# Patient Record
Sex: Female | Born: 1960 | Race: White | Hispanic: No | Marital: Married | Smoking: Former smoker
Health system: Southern US, Community
[De-identification: ages and names within clinical notes are randomized; demographics above are authoritative.]

## PROBLEM LIST (undated history)

## (undated) DIAGNOSIS — M459 Ankylosing spondylitis of unspecified sites in spine: Secondary | ICD-10-CM

## (undated) HISTORY — PX: DILATION AND CURETTAGE OF UTERUS: SHX78

## (undated) HISTORY — PX: UTERINE FIBROID SURGERY: SHX826

## (undated) HISTORY — PX: TONSILLECTOMY: SUR1361

---

## 1998-05-19 ENCOUNTER — Encounter: Payer: Self-pay | Admitting: Obstetrics and Gynecology

## 1998-05-19 ENCOUNTER — Ambulatory Visit (HOSPITAL_COMMUNITY): Admission: RE | Admit: 1998-05-19 | Discharge: 1998-05-19 | Payer: Self-pay | Admitting: Obstetrics and Gynecology

## 1998-07-04 ENCOUNTER — Ambulatory Visit (HOSPITAL_COMMUNITY): Admission: RE | Admit: 1998-07-04 | Discharge: 1998-07-04 | Payer: Self-pay | Admitting: Obstetrics and Gynecology

## 1998-08-25 ENCOUNTER — Other Ambulatory Visit: Admission: RE | Admit: 1998-08-25 | Discharge: 1998-08-25 | Payer: Self-pay | Admitting: Obstetrics and Gynecology

## 1999-07-29 ENCOUNTER — Encounter (INDEPENDENT_AMBULATORY_CARE_PROVIDER_SITE_OTHER): Payer: Self-pay | Admitting: Specialist

## 1999-07-29 ENCOUNTER — Inpatient Hospital Stay (HOSPITAL_COMMUNITY): Admission: RE | Admit: 1999-07-29 | Discharge: 1999-07-30 | Payer: Self-pay | Admitting: Obstetrics and Gynecology

## 1999-10-02 ENCOUNTER — Other Ambulatory Visit: Admission: RE | Admit: 1999-10-02 | Discharge: 1999-10-02 | Payer: Self-pay | Admitting: Obstetrics and Gynecology

## 2001-01-23 ENCOUNTER — Other Ambulatory Visit: Admission: RE | Admit: 2001-01-23 | Discharge: 2001-01-23 | Payer: Self-pay | Admitting: Obstetrics and Gynecology

## 2002-11-12 ENCOUNTER — Other Ambulatory Visit: Admission: RE | Admit: 2002-11-12 | Discharge: 2002-11-12 | Payer: Self-pay | Admitting: Obstetrics and Gynecology

## 2005-01-14 ENCOUNTER — Other Ambulatory Visit: Admission: RE | Admit: 2005-01-14 | Discharge: 2005-01-14 | Payer: Self-pay | Admitting: Obstetrics and Gynecology

## 2009-08-23 ENCOUNTER — Emergency Department (HOSPITAL_BASED_OUTPATIENT_CLINIC_OR_DEPARTMENT_OTHER): Admission: EM | Admit: 2009-08-23 | Discharge: 2009-08-23 | Payer: Self-pay | Admitting: Emergency Medicine

## 2009-08-23 ENCOUNTER — Ambulatory Visit: Payer: Self-pay | Admitting: Diagnostic Radiology

## 2011-07-07 ENCOUNTER — Ambulatory Visit (INDEPENDENT_AMBULATORY_CARE_PROVIDER_SITE_OTHER): Payer: 59 | Admitting: *Deleted

## 2011-07-07 DIAGNOSIS — I781 Nevus, non-neoplastic: Secondary | ICD-10-CM | POA: Insufficient documentation

## 2011-07-07 NOTE — Progress Notes (Signed)
X=.3% Sotradecol administered with a 27g butterfly.  Patient received a total of 12cc foam.  Treated all areas of concern; easy access. Tol well. Anticipate good results. Follow prn.  Photos: yes  Compression stockings applied: yes

## 2011-07-08 ENCOUNTER — Encounter: Payer: Self-pay | Admitting: *Deleted

## 2011-08-18 ENCOUNTER — Ambulatory Visit: Payer: Self-pay | Admitting: *Deleted

## 2011-09-28 ENCOUNTER — Encounter: Payer: Self-pay | Admitting: *Deleted

## 2011-09-29 ENCOUNTER — Ambulatory Visit (INDEPENDENT_AMBULATORY_CARE_PROVIDER_SITE_OTHER): Payer: 59 | Admitting: *Deleted

## 2011-09-29 DIAGNOSIS — I781 Nevus, non-neoplastic: Secondary | ICD-10-CM

## 2011-09-29 NOTE — Progress Notes (Signed)
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 I called in Silvadene for her to apply to it last week and she says the ulcer is now rapidly improving. Looks like she will not be left with a big scar. The area was about an inch in diameter initially. Now it is smaller than an eraser head. Patient not upset by this and very nice about it. Understands that the consent did say this could happen. If I inject her again, I will dilut the sotradecol. Other ares are resolving nicely. Will follow prn.

## 2013-10-12 ENCOUNTER — Emergency Department (HOSPITAL_COMMUNITY)
Admission: EM | Admit: 2013-10-12 | Discharge: 2013-10-12 | Disposition: A | Discharge status: Home / Self Care | Payer: 59 | Source: Home / Self Care | Attending: Family Medicine | Admitting: Family Medicine

## 2013-10-12 ENCOUNTER — Emergency Department (INDEPENDENT_AMBULATORY_CARE_PROVIDER_SITE_OTHER): Payer: 59

## 2013-10-12 ENCOUNTER — Encounter (HOSPITAL_COMMUNITY): Payer: Self-pay | Admitting: Emergency Medicine

## 2013-10-12 DIAGNOSIS — R059 Cough, unspecified: Secondary | ICD-10-CM

## 2013-10-12 DIAGNOSIS — R05 Cough: Secondary | ICD-10-CM

## 2013-10-12 HISTORY — DX: Ankylosing spondylitis of unspecified sites in spine: M45.9

## 2013-10-12 MED ORDER — GUAIFENESIN-CODEINE 100-10 MG/5ML PO SOLN: 5.0000 mL | Freq: Every evening | ORAL | Status: DC | PRN

## 2013-10-12 MED ORDER — PREDNISONE 10 MG PO TABS: 30.0000 mg | ORAL_TABLET | Freq: Every day | ORAL | Status: DC

## 2013-10-12 NOTE — ED Notes (Signed)
Sore throat, uncontrollable cough x 3 weeks ago. Patient states she has swollen glands in her neck that started last week. Patient had contacted PCP two weeks ago and was prescribed Augmentin, Flonase, and an inhaler. Patient has completed that treatment with no relief.

## 2013-10-12 NOTE — Discharge Instructions (Signed)
Thank you for coming in today. Resume flonase.  Take prednisone daily.  Use albuterol as needed.  Use codeine cough medicine as needed as well.  Call or go to the emergency room if you get worse, have trouble breathing, have chest pains, or palpitations.    Cough, Adult  A cough is a reflex that helps clear your throat and airways. It can help heal the body or may be a reaction to an irritated airway. A cough may only last 2 or 3 weeks (acute) or may last more than 8 weeks (chronic).  CAUSES Acute cough:  Viral or bacterial infections. Chronic cough:  Infections.  Allergies.  Asthma.  Post-nasal drip.  Smoking.  Heartburn or acid reflux.  Some medicines.  Chronic lung problems (COPD).  Cancer. SYMPTOMS   Cough.  Fever.  Chest pain.  Increased breathing rate.  High-pitched whistling sound when breathing (wheezing).  Colored mucus that you cough up (sputum). TREATMENT   A bacterial cough may be treated with antibiotic medicine.  A viral cough must run its course and will not respond to antibiotics.  Your caregiver may recommend other treatments if you have a chronic cough. HOME CARE INSTRUCTIONS   Only take over-the-counter or prescription medicines for pain, discomfort, or fever as directed by your caregiver. Use cough suppressants only as directed by your caregiver.  Use a cold steam vaporizer or humidifier in your bedroom or home to help loosen secretions.  Sleep in a semi-upright position if your cough is worse at night.  Rest as needed.  Stop smoking if you smoke. SEEK IMMEDIATE MEDICAL CARE IF:   You have pus in your sputum.  Your cough starts to worsen.  You cannot control your cough with suppressants and are losing sleep.  You begin coughing up blood.  You have difficulty breathing.  You develop pain which is getting worse or is uncontrolled with medicine.  You have a fever. MAKE SURE YOU:   Understand these instructions.  Will  watch your condition.  Will get help right away if you are not doing well or get worse. Document Released: 10/02/2010 Document Revised: 06/28/2011 Document Reviewed: 10/02/2010 [facility] Patient Information 2015 [place], [place]. This information is not intended to replace advice given to you by your health care provider. Make sure you discuss any questions you have with your health care provider.

## 2013-10-12 NOTE — ED Provider Notes (Signed)
Alice Moore is a 53 y.o. female who presents to Urgent Care today for cough. Patient has had a bothersome productive cough for the last 3 weeks. This is associated with sore tender swollen cervical lymph nodes. She denies any shortness of breath fevers chills nausea vomiting or diarrhea. Her primary care provider treated with Augmentin albuterol Flonase about 2 weeks ago. Her symptoms persist. She is relatively immunocompromised because of Remicade do to ankylosing spondylitis.    Past Medical History  Diagnosis Date  . AS (ankylosing spondylitis)    History  Substance Use Topics  . Smoking status: Never Smoker   . Smokeless tobacco: Never Used  . Alcohol Use: Yes   ROS as above Medications: No current facility-administered medications for this encounter.   Current Outpatient Prescriptions  Medication Sig Dispense Refill  . InFLIXimab (REMICADE IV) Inject into the vein.      . Norethindrone (CAMILA PO) Take by mouth.      . sertraline (ZOLOFT) 50 MG tablet Take 50 mg by mouth daily.      Marland Kitchen. guaiFENesin-codeine 100-10 MG/5ML syrup Take 5 mLs by mouth at bedtime as needed for cough.  120 mL  0  . predniSONE (DELTASONE) 10 MG tablet Take 3 tablets (30 mg total) by mouth daily.  15 tablet  0    Exam:  BP 137/88  Pulse 65  Temp(Src) 98 F (36.7 C)  SpO2 99% Gen: Well NAD HEENT: EOMI,  MMM posterior pharynx with cobblestoning. Tympanic membranes are normal appearing bilaterally. Lungs: Normal work of breathing. CTABL Heart: RRR no MRG Abd: NABS, Soft. NT, ND Exts: Brisk capillary refill, warm and well perfused.   No results found for this or any previous visit (from the past 24 hour(s)). Dg Chest 2 View  10/12/2013   CLINICAL DATA:  Cough  EXAM: CHEST  2 VIEW  COMPARISON:  None.  FINDINGS: The heart size and mediastinal contours are within normal limits. Both lungs are clear. The visualized skeletal structures are unremarkable.  IMPRESSION: No active cardiopulmonary disease.    Electronically Signed   By: Alcide CleverMark  Lukens M.D.   On: 10/12/2013 16:26    Assessment and Plan: 53 y.o. female with postviral cough. Plan to treat with prednisone and codeine containing cough medication. Albuterol and Flonase as needed  Discussed warning signs or symptoms. Please see discharge instructions. Patient expresses understanding.    Rodolph BongEvan S Corey, MD 10/12/13 [phone]

## 2013-10-18 ENCOUNTER — Encounter (HOSPITAL_COMMUNITY): Payer: Self-pay | Admitting: Emergency Medicine

## 2013-10-18 ENCOUNTER — Emergency Department (HOSPITAL_COMMUNITY)
Admission: EM | Admit: 2013-10-18 | Discharge: 2013-10-18 | Disposition: A | Discharge status: Home / Self Care | Payer: 59 | Source: Home / Self Care

## 2013-10-18 DIAGNOSIS — R0982 Postnasal drip: Secondary | ICD-10-CM

## 2013-10-18 DIAGNOSIS — R05 Cough: Secondary | ICD-10-CM

## 2013-10-18 DIAGNOSIS — J9801 Acute bronchospasm: Secondary | ICD-10-CM

## 2013-10-18 DIAGNOSIS — J3089 Other allergic rhinitis: Secondary | ICD-10-CM

## 2013-10-18 DIAGNOSIS — R059 Cough, unspecified: Secondary | ICD-10-CM

## 2013-10-18 DIAGNOSIS — J302 Other seasonal allergic rhinitis: Secondary | ICD-10-CM

## 2013-10-18 MED ORDER — GUAIFENESIN-CODEINE 100-10 MG/5ML PO SYRP: ORAL_SOLUTION | ORAL | Status: DC

## 2013-10-18 NOTE — Discharge Instructions (Signed)
Allergic Rhinitis USe albuterol every 4 hours as needed, little more than you have been Allegra 180 mg a day. If needing stronger one take ChlorTrimeton 2 mg every 6 hours for drainage and cough. May caused drowsiness. Stop the Mucinex D. Continue Flonase once a day. Allergic rhinitis is when the mucous membranes in the nose respond to allergens. Allergens are particles in the air that cause your body to have an allergic reaction. This causes you to release allergic antibodies. Through a chain of events, these eventually cause you to release histamine into the blood stream. Although meant to protect the body, it is this release of histamine that causes your discomfort, such as frequent sneezing, congestion, and an itchy, runny nose.  CAUSES  Seasonal allergic rhinitis (hay fever) is caused by pollen allergens that may come from grasses, trees, and weeds. Year-round allergic rhinitis (perennial allergic rhinitis) is caused by allergens such as house dust mites, pet dander, and mold spores.  SYMPTOMS   Nasal stuffiness (congestion).  Itchy, runny nose with sneezing and tearing of the eyes. DIAGNOSIS  Your health care provider can help you determine the allergen or allergens that trigger your symptoms. If you and your health care provider are unable to determine the allergen, skin or blood testing may be used. TREATMENT  Allergic rhinitis does not have a cure, but it can be controlled by:  Medicines and allergy shots (immunotherapy).  Avoiding the allergen. Hay fever may often be treated with antihistamines in pill or nasal spray forms. Antihistamines block the effects of histamine. There are over-the-counter medicines that may help with nasal congestion and swelling around the eyes. Check with your health care provider before taking or giving this medicine.  If avoiding the allergen or the medicine prescribed do not work, there are many new medicines your health care provider can prescribe.  Stronger medicine may be used if initial measures are ineffective. Desensitizing injections can be used if medicine and avoidance does not work. Desensitization is when a patient is given ongoing shots until the body becomes less sensitive to the allergen. Make sure you follow up with your health care provider if problems continue. HOME CARE INSTRUCTIONS It is not possible to completely avoid allergens, but you can reduce your symptoms by taking steps to limit your exposure to them. It helps to know exactly what you are allergic to so that you can avoid your specific triggers. SEEK MEDICAL CARE IF:   You have a fever.  You develop a cough that does not stop easily (persistent).  You have shortness of breath.  You start wheezing.  Symptoms interfere with normal daily activities. Document Released: 12/29/2000 Document Revised: 04/10/2013 Document Reviewed: 12/11/2012 [facility]Exit[facility] Patient Information 2015 [place], [place]. This information is not intended to replace advice given to you by your health care provider. Make sure you discuss any questions you have with your health care provider.  Cough, Adult  A cough is a reflex that helps clear your throat and airways. It can help heal the body or may be a reaction to an irritated airway. A cough may only last 2 or 3 weeks (acute) or may last more than 8 weeks (chronic).  CAUSES Acute cough:  Viral or bacterial infections. Chronic cough:  Infections.  Allergies.  Asthma.  Post-nasal drip.  Smoking.  Heartburn or acid reflux.  Some medicines.  Chronic lung problems (COPD).  Cancer. SYMPTOMS   Cough.  Fever.  Chest pain.  Increased breathing rate.  High-pitched whistling sound when  breathing (wheezing).  Colored mucus that you cough up (sputum). TREATMENT   A bacterial cough may be treated with antibiotic medicine.  A viral cough must run its course and will not respond to antibiotics.  Your caregiver may recommend  other treatments if you have a chronic cough. HOME CARE INSTRUCTIONS   Only take over-the-counter or prescription medicines for pain, discomfort, or fever as directed by your caregiver. Use cough suppressants only as directed by your caregiver.  Use a cold steam vaporizer or humidifier in your bedroom or home to help loosen secretions.  Sleep in a semi-upright position if your cough is worse at night.  Rest as needed.  Stop smoking if you smoke. SEEK IMMEDIATE MEDICAL CARE IF:   You have pus in your sputum.  Your cough starts to worsen.  You cannot control your cough with suppressants and are losing sleep.  You begin coughing up blood.  You have difficulty breathing.  You develop pain which is getting worse or is uncontrolled with medicine.  You have a fever. MAKE SURE YOU:   Understand these instructions.  Will watch your condition.  Will get help right away if you are not doing well or get worse. Document Released: 10/02/2010 Document Revised: 06/28/2011 Document Reviewed: 10/02/2010 [facility] Patient Information 2015 [place], [place]. This information is not intended to replace advice given to you by your health care provider. Make sure you discuss any questions you have with your health care provider.  Bronchospasm A bronchospasm is when the tubes that carry air in and out of your lungs (airways) spasm or tighten. During a bronchospasm it is hard to breathe. This is because the airways get smaller. A bronchospasm can be triggered by:  Allergies. These may be to animals, pollen, food, or mold.  Infection. This is a common cause of bronchospasm.  Exercise.  Irritants. These include pollution, cigarette smoke, strong odors, aerosol sprays, and paint fumes.  Weather changes.  Stress.  Being emotional. HOME CARE   Always have a plan for getting help. Know when to call your doctor and local emergency services (911 in the [place] Know where you can get emergency  care.  Only take medicines as told by your doctor.  If you were prescribed an inhaler or nebulizer machine, ask your doctor how to use it correctly. Always use a spacer with your inhaler if you were given one.  Stay calm during an attack. Try to relax and breathe more slowly.  Control your home environment:  Change your heating and air conditioning filter at least once a month.  Limit your use of fireplaces and wood stoves.  Do not  smoke. Do not  allow smoking in your home.  Avoid perfumes and fragrances.  Get rid of pests (such as roaches and mice) and their droppings.  Throw away plants if you see mold on them.  Keep your house clean and dust free.  Replace carpet with wood, tile, or vinyl flooring. Carpet can trap dander and dust.  Use allergy-proof pillows, mattress covers, and box spring covers.  Wash bed sheets and blankets every week in hot water. Dry them in a dryer.  Use blankets that are made of polyester or cotton.  Wash hands frequently. GET HELP IF:  You have muscle aches.  You have chest pain.  The thick spit you spit or cough up (sputum) changes from clear or white to yellow, green, gray, or bloody.  The thick spit you spit or cough up gets thicker.  There are problems that may be related to the medicine you are given such as:  A rash.  Itching.  Swelling.  Trouble breathing. GET HELP RIGHT AWAY IF:  You feel you cannot breathe or catch your breath.  You cannot stop coughing.  Your treatment is not helping you breathe better.  You have very bad chest pain. MAKE SURE YOU:   Understand these instructions.  Will watch your condition.  Will get help right away if you are not doing well or get worse. Document Released: 01/31/2009 Document Revised: 04/10/2013 Document Reviewed: 09/26/2012 [facility] Patient Information 2015 [place], [place]. This information is not intended to replace advice given to you by your health care provider. Make  sure you discuss any questions you have with your health care provider.  How to Use an Inhaler Using your inhaler correctly is very important. Good technique will make sure that the medicine reaches your lungs.  HOW TO USE AN INHALER: 1. Take the cap off the inhaler. 2. If this is the first time using your inhaler, you need to prime it. Shake the inhaler for 5 seconds. Release four puffs into the air, away from your face. Ask your doctor for help if you have questions. 3. Shake the inhaler for 5 seconds. 4. Turn the inhaler so the bottle is above the mouthpiece. 5. Put your pointer finger on top of the bottle. Your thumb holds the bottom of the inhaler. 6. Open your mouth. 7. Either hold the inhaler away from your mouth (the width of 2 fingers) or place your lips tightly around the mouthpiece. Ask your doctor which way to use your inhaler. 8. Breathe out as much air as possible. 9. Breathe in and push down on the bottle 1 time to release the medicine. You will feel the medicine go in your mouth and throat. 10. Continue to take a deep breath in very slowly. Try to fill your lungs. 11. After you have breathed in completely, hold your breath for 10 seconds. This will help the medicine to settle in your lungs. If you cannot hold your breath for 10 seconds, hold it for as long as you can before you breathe out. 12. Breathe out slowly, through pursed lips. Whistling is an example of pursed lips. 13. If your doctor has told you to take more than 1 puff, wait at least 15-30 seconds between puffs. This will help you get the best results from your medicine. Do not use the inhaler more than your doctor tells you to. 14. Put the cap back on the inhaler. 15. Follow the directions from your doctor or from the inhaler package about cleaning the inhaler. If you use more than one inhaler, ask your doctor which inhalers to use and what order to use them in. Ask your doctor to help you figure out when you will need  to refill your inhaler.  If you use a steroid inhaler, always rinse your mouth with water after your last puff, gargle and spit out the water. Do not swallow the water. GET HELP IF:  The inhaler medicine only partially helps to stop wheezing or shortness of breath.  You are having trouble using your inhaler.  You have some increase in thick spit (phlegm). GET HELP RIGHT AWAY IF:  The inhaler medicine does not help your wheezing or shortness of breath or you have tightness in your chest.  You have dizziness, headaches, or fast heart rate.  You have chills, fever, or night sweats.  You  have a large increase of thick spit, or your thick spit is bloody. MAKE SURE YOU:   Understand these instructions.  Will watch your condition.  Will get help right away if you are not doing well or get worse. Document Released: 01/13/2008 Document Revised: 01/24/2013 Document Reviewed: 11/02/2012 [facility] Patient Information 2015 [place], [place]. This information is not intended to replace advice given to you by your health care provider. Make sure you discuss any questions you have with your health care provider.

## 2013-10-18 NOTE — ED Provider Notes (Signed)
CSN: [id number]     Arrival date & time 10/18/13  1650 History   First MD Initiated Contact with Patient 10/18/13 1754     Chief Complaint  Patient presents with  . Cough   (Consider location/radiation/quality/duration/timing/severity/associated sxs/prior Treatment) HPI Comments: 53 year old female complaining of cough for a month. She has a history of seasonal allergies that began when she started using Remicade for ankylosing spondylitis. She was seen approximately one week ago and treated with cough medicine with codeine and steroids. She did have partial and temporary relief. She is taking Mucinex D. She is not taking an antihistamine.  Patient is a 53 y.o. female presenting with cough.  Cough Associated symptoms: rhinorrhea   Associated symptoms: no fever, no shortness of breath and no sore throat     Past Medical History  Diagnosis Date  . AS (ankylosing spondylitis)    Past Surgical History  Procedure Laterality Date  . Tonsillectomy     Family History  Problem Relation Age of Onset  . Cancer Mother   . Stroke Father   . Heart failure Father   . Cancer Sister    History  Substance Use Topics  . Smoking status: Never Smoker   . Smokeless tobacco: Never Used  . Alcohol Use: Yes   OB History   Grav Para Term Preterm Abortions TAB SAB Ect Mult Living                 Review of Systems  Constitutional: Positive for activity change. Negative for fever and fatigue.  HENT: Positive for congestion and rhinorrhea. Negative for postnasal drip and sore throat.   Eyes: Negative.   Respiratory: Positive for cough. Negative for chest tightness and shortness of breath.   Cardiovascular: Negative.   Gastrointestinal: Negative.   Neurological: Negative.     Allergies  Review of patient's allergies indicates no known allergies.  Home Medications   Prior to Admission medications   Medication Sig Start Date End Date Taking? Authorizing Provider  InFLIXimab (REMICADE IV)  Inject into the vein.   Yes Historical Provider, MD  Norethindrone (CAMILA PO) Take by mouth.   Yes Historical Provider, MD  sertraline (ZOLOFT) 50 MG tablet Take 50 mg by mouth daily.   Yes Historical Provider, MD  guaiFENesin-codeine 100-10 MG/5ML syrup Take 5 mLs by mouth at bedtime as needed for cough. 10/12/13   Rodolph BongEvan S Corey, MD  predniSONE (DELTASONE) 10 MG tablet Take 3 tablets (30 mg total) by mouth daily. 10/12/13   Rodolph BongEvan S Corey, MD   BP 137/86  Pulse 72  Temp(Src) 98.4 F (36.9 C) (Oral)  Resp 16  SpO2 97% Physical Exam  Nursing note and vitals reviewed. Constitutional: She is oriented to person, place, and time. She appears well-developed and well-nourished. No distress.  HENT:  Oropharynx with diffuse streaking redness, no exudates. Positive for clear PND.  Eyes: Conjunctivae and EOM are normal.  Neck: Normal range of motion. Neck supple.  Cardiovascular: Normal rate, regular rhythm and normal heart sounds.   Pulmonary/Chest: Effort normal and breath sounds normal. No respiratory distress.  Intermittent faint , end expiratory wheeze. Otherwise clear.  Musculoskeletal: Normal range of motion. She exhibits no edema.  Lymphadenopathy:    She has no cervical adenopathy.  Neurological: She is alert and oriented to person, place, and time.  Skin: Skin is warm and dry. No rash noted.  Psychiatric: She has a normal mood and affect.    ED Course  Procedures (including critical care time)  Labs Review Labs Reviewed - No data to display  Imaging Review No results found.   MDM   1. Cough   2. Other seasonal allergic rhinitis   3. PND (post-nasal drip)   4. Bronchospasm    Patient has no fever and no current signs of infection. Her chest x-ray last week was negative for pathology. She is not taking an antihistamine. She is not using her albuterol HFA except on rare occasion. Most likely her cough is attributed to untreated PND and occult bronchospasm. USe albuterol every 4  hours as needed, little more than you have been Allegra 180 mg a day. If needing stronger one take ChlorTrimeton 2 mg every 6 hours for drainage and cough. May caused drowsiness. Stop the Mucinex D. Continue Flonase once a day. Follow with PCP    Hayden Rasmussenavid Jayke Caul, NP 10/18/13 1845

## 2013-10-18 NOTE — ED Notes (Signed)
4 week history of cough.  Has seen her pcp and has been at [facility] for the same.  Cough continues.

## 2013-10-19 NOTE — ED Provider Notes (Signed)
Medical screening examination/treatment/procedure(s) were performed by a resident physician or non-physician practitioner and as the supervising physician I was immediately available for consultation/collaboration.  Evan Corey, MD    Evan S Corey, MD 10/19/13 1652 

## 2014-12-09 ENCOUNTER — Emergency Department (HOSPITAL_BASED_OUTPATIENT_CLINIC_OR_DEPARTMENT_OTHER): Payer: 59

## 2014-12-09 ENCOUNTER — Emergency Department (HOSPITAL_BASED_OUTPATIENT_CLINIC_OR_DEPARTMENT_OTHER)
Admission: EM | Admit: 2014-12-09 | Discharge: 2014-12-09 | Disposition: A | Discharge status: Home / Self Care | Payer: 59 | Attending: Emergency Medicine | Admitting: Emergency Medicine

## 2014-12-09 ENCOUNTER — Encounter (HOSPITAL_BASED_OUTPATIENT_CLINIC_OR_DEPARTMENT_OTHER): Payer: Self-pay

## 2014-12-09 DIAGNOSIS — Y9289 Other specified places as the place of occurrence of the external cause: Secondary | ICD-10-CM | POA: Insufficient documentation

## 2014-12-09 DIAGNOSIS — W108XXA Fall (on) (from) other stairs and steps, initial encounter: Secondary | ICD-10-CM | POA: Insufficient documentation

## 2014-12-09 DIAGNOSIS — Z7951 Long term (current) use of inhaled steroids: Secondary | ICD-10-CM | POA: Insufficient documentation

## 2014-12-09 DIAGNOSIS — S93402A Sprain of unspecified ligament of left ankle, initial encounter: Secondary | ICD-10-CM | POA: Diagnosis not present

## 2014-12-09 DIAGNOSIS — S99912A Unspecified injury of left ankle, initial encounter: Secondary | ICD-10-CM | POA: Diagnosis present

## 2014-12-09 DIAGNOSIS — S99922A Unspecified injury of left foot, initial encounter: Secondary | ICD-10-CM | POA: Diagnosis not present

## 2014-12-09 DIAGNOSIS — Y9301 Activity, walking, marching and hiking: Secondary | ICD-10-CM | POA: Diagnosis not present

## 2014-12-09 DIAGNOSIS — Y998 Other external cause status: Secondary | ICD-10-CM | POA: Insufficient documentation

## 2014-12-09 NOTE — ED Notes (Signed)
Patient transported to X-ray via stretcher per tech. 

## 2014-12-09 NOTE — ED Provider Notes (Signed)
CSN: [id number]     Arrival date & time 12/09/14  1122 History   First MD Initiated Contact with Patient 12/09/14 1128     Chief Complaint  Patient presents with  . Ankle Injury     (Consider location/radiation/quality/duration/timing/severity/associated sxs/prior Treatment) HPI Comments: Patient presents with left ankle pain that began acutely approximately one hour prior to arrival when the patient was walking down some stairs and fell. She states that she fell down approximately 3 stairs. She twisted her left ankle and sustained an abrasion to her right knee. She denies hitting her head or hurting her neck. She applied ice prior to arrival. No other treatments. Patient denies knee pain or hip pain. She has been in able to bear weight on her foot. Course is constant. Bearing weight makes pain worse.  Patient is a 54 y.o. female presenting with lower extremity injury. The history is provided by the patient.  Ankle Injury This is a new problem. Associated symptoms include arthralgias. Pertinent negatives include no joint swelling, neck pain, numbness or weakness.    Past Medical History  Diagnosis Date  . AS (ankylosing spondylitis)    Past Surgical History  Procedure Laterality Date  . Tonsillectomy     Family History  Problem Relation Age of Onset  . Cancer Mother   . Stroke Father   . Heart failure Father   . Cancer Sister    Social History  Substance Use Topics  . Smoking status: Never Smoker   . Smokeless tobacco: Never Used  . Alcohol Use: Yes   OB History    No data available     Review of Systems  Constitutional: Negative for activity change.  Musculoskeletal: Positive for arthralgias. Negative for back pain, joint swelling and neck pain.  Skin: Negative for wound.  Neurological: Negative for weakness and numbness.      Allergies  Review of patient's allergies indicates no known allergies.  Home Medications   Prior to Admission medications   Medication  Sig Start Date End Date Taking? Authorizing Provider  Celecoxib (CELEBREX PO) Take by mouth.   Yes Historical Provider, MD  Cetirizine HCl (ZYRTEC PO) Take by mouth.   Yes Historical Provider, MD  estradiol (VIVELLE-DOT) 0.05 MG/24HR patch Place 1 patch onto the skin 2 (two) times a week.   Yes Historical Provider, MD  fluticasone (FLONASE) 50 MCG/ACT nasal spray Place into both nostrils daily.   Yes Historical Provider, MD  Progesterone Micronized (PROGESTERONE PO) Take by mouth.   Yes Historical Provider, MD  InFLIXimab (REMICADE IV) Inject into the vein.    Historical Provider, MD   BP 143/90 mmHg  Pulse 67  Temp(Src) 97.9 F (36.6 C) (Oral)  Resp 16  Ht 5\' 7"  (1.702 m)  Wt 164 lb (74.39 kg)  BMI 25.68 kg/m2  SpO2 99% Physical Exam  Constitutional: She appears well-developed and well-nourished.  HENT:  Head: Normocephalic and atraumatic.  Eyes: Conjunctivae are normal.  Neck: Normal range of motion. Neck supple.  Cardiovascular:  Pulses:      Dorsalis pedis pulses are 2+ on the right side, and 2+ on the left side.       Posterior tibial pulses are 2+ on the right side, and 2+ on the left side.  Musculoskeletal: She exhibits edema and tenderness.       Left hip: Normal.       Left knee: Normal.       Left ankle: She exhibits decreased range of motion. She exhibits  no swelling and no deformity. Tenderness. No lateral malleolus, no medial malleolus, no head of 5th metatarsal and no proximal fibula tenderness found. Achilles tendon normal.       Left lower leg: Normal.       Left foot: There is tenderness. There is normal range of motion and no bony tenderness.       Feet:  Patient complains of pain with palpation of the medial/lateral right/left ankle. She denies pain with palpation over the fibular head of the affected side. She denies pain in the hip of the affected side.  Neurological: She is alert.  Distal motor, sensation, and vascular intact.   Skin: Skin is warm and dry.    Psychiatric: She has a normal mood and affect.  Nursing note and vitals reviewed.   ED Course  Procedures (including critical care time) Labs Review Labs Reviewed - No data to display  Imaging Review Dg Ankle Complete Left  12/09/2014   CLINICAL DATA:  Left ankle pain after fall down stairs today.  EXAM: LEFT ANKLE COMPLETE - 3+ VIEW  COMPARISON:  None.  FINDINGS: There is no evidence of fracture, dislocation, or joint effusion. There is no evidence of arthropathy or other focal bone abnormality. Soft tissues are unremarkable.  IMPRESSION: Negative.   Electronically Signed   By: Elberta Fortis M.D.   On: 12/09/2014 12:09   I have personally reviewed and evaluated these images and lab results as part of my medical decision-making.   EKG Interpretation None       11:54 AM Patient seen and examined. Pending x-ray.    Vital signs reviewed and are as follows: BP 143/90 mmHg  Pulse 67  Temp(Src) 97.9 F (36.6 C) (Oral)  Resp 16  Ht  (1.702 m)  Wt 164 lb (74.39 kg)  BMI 25.68 kg/m2  SpO2 99%  12:43 PM Patient informed of x-ray results. ASO given. Patient was counseled on RICE protocol and told to rest injury, use ice for no longer than 15 minutes every hour, compress the area, and elevate above the level of their heart as much as possible to reduce swelling. Questions answered. Patient verbalized understanding.     MDM   Final diagnoses:  Ankle sprain, left, initial encounter   Patient with ankle injury after a fall. X-rays are negative. Do not suspect other significant injury. Lower extremity is neurovascularly intact. Rice protocol and conservative measures indicated at this time with PCP follow-up if not improved in one week.    Renne Crigler, PA-C 12/09/14 1247  Gerhard Munch, MD 12/09/14 [phone]

## 2014-12-09 NOTE — Discharge Instructions (Signed)
Please read and follow all provided instructions.  Your diagnoses today include:  1. Ankle sprain, right, initial encounter     Tests performed today include:  An x-ray of your ankle - does NOT show any broken bones  Vital signs. See below for your results today.   Medications prescribed:   None  Take any prescribed medications only as directed.  Home care instructions:   Follow any educational materials contained in this packet  Follow R.I.C.E. Protocol:  R - rest your injury   I  - use ice on injury without applying directly to skin  C - compress injury with bandage or splint  E - elevate the injury as much as possible  Follow-up instructions: Please follow-up with your primary care provider if you continue to have significant pain or trouble walking in 1 week. In this case you may have a severe sprain that requires further care.   Return instructions:   Please return if your toes are numb or tingling, appear gray or blue, or you have severe pain (also elevate leg and loosen splint or wrap)  Please return to the Emergency Department if you experience worsening symptoms.   Please return if you have any other emergent concerns.  Additional Information:  Your vital signs today were: BP 143/90 mmHg   Pulse 67   Temp(Src) 97.9 F (36.6 C) (Oral)   Resp 16   Ht  (1.702 m)   Wt 164 lb (74.39 kg)   BMI 25.68 kg/m2   SpO2 99% If your blood pressure (BP) was elevated above 135/85 this visit, please have this repeated by your doctor within one month. --------------

## 2014-12-09 NOTE — ED Notes (Signed)
Fell on stairs approx 1-2 hours PTA-left ankle pain

## 2016-04-26 DIAGNOSIS — M450 Ankylosing spondylitis of multiple sites in spine: Secondary | ICD-10-CM | POA: Diagnosis not present

## 2016-06-07 DIAGNOSIS — Z79899 Other long term (current) drug therapy: Secondary | ICD-10-CM | POA: Diagnosis not present

## 2016-06-07 DIAGNOSIS — Z1231 Encounter for screening mammogram for malignant neoplasm of breast: Secondary | ICD-10-CM | POA: Diagnosis not present

## 2016-06-07 DIAGNOSIS — M450 Ankylosing spondylitis of multiple sites in spine: Secondary | ICD-10-CM | POA: Diagnosis not present

## 2016-07-05 DIAGNOSIS — Z0000 Encounter for general adult medical examination without abnormal findings: Secondary | ICD-10-CM | POA: Diagnosis not present

## 2016-07-05 DIAGNOSIS — N390 Urinary tract infection, site not specified: Secondary | ICD-10-CM | POA: Diagnosis not present

## 2016-07-12 DIAGNOSIS — Z1389 Encounter for screening for other disorder: Secondary | ICD-10-CM | POA: Diagnosis not present

## 2016-07-12 DIAGNOSIS — H209 Unspecified iridocyclitis: Secondary | ICD-10-CM | POA: Diagnosis not present

## 2016-07-12 DIAGNOSIS — M458 Ankylosing spondylitis sacral and sacrococcygeal region: Secondary | ICD-10-CM | POA: Diagnosis not present

## 2016-07-12 DIAGNOSIS — Z0000 Encounter for general adult medical examination without abnormal findings: Secondary | ICD-10-CM | POA: Diagnosis not present

## 2016-07-12 DIAGNOSIS — R030 Elevated blood-pressure reading, without diagnosis of hypertension: Secondary | ICD-10-CM | POA: Diagnosis not present

## 2016-07-19 DIAGNOSIS — M450 Ankylosing spondylitis of multiple sites in spine: Secondary | ICD-10-CM | POA: Diagnosis not present

## 2016-08-16 DIAGNOSIS — H2011 Chronic iridocyclitis, right eye: Secondary | ICD-10-CM | POA: Diagnosis not present

## 2016-08-16 DIAGNOSIS — M450 Ankylosing spondylitis of multiple sites in spine: Secondary | ICD-10-CM | POA: Diagnosis not present

## 2016-08-16 DIAGNOSIS — M545 Low back pain: Secondary | ICD-10-CM | POA: Diagnosis not present

## 2016-08-30 DIAGNOSIS — M450 Ankylosing spondylitis of multiple sites in spine: Secondary | ICD-10-CM | POA: Diagnosis not present

## 2016-09-27 DIAGNOSIS — Z01419 Encounter for gynecological examination (general) (routine) without abnormal findings: Secondary | ICD-10-CM | POA: Diagnosis not present

## 2016-09-27 DIAGNOSIS — Z6824 Body mass index (BMI) 24.0-24.9, adult: Secondary | ICD-10-CM | POA: Diagnosis not present

## 2016-09-27 DIAGNOSIS — R3121 Asymptomatic microscopic hematuria: Secondary | ICD-10-CM | POA: Diagnosis not present

## 2016-09-27 DIAGNOSIS — N951 Menopausal and female climacteric states: Secondary | ICD-10-CM | POA: Diagnosis not present

## 2016-10-11 DIAGNOSIS — M450 Ankylosing spondylitis of multiple sites in spine: Secondary | ICD-10-CM | POA: Diagnosis not present

## 2016-11-22 DIAGNOSIS — M450 Ankylosing spondylitis of multiple sites in spine: Secondary | ICD-10-CM | POA: Diagnosis not present

## 2016-12-13 DIAGNOSIS — H20021 Recurrent acute iridocyclitis, right eye: Secondary | ICD-10-CM | POA: Diagnosis not present

## 2017-01-03 DIAGNOSIS — Z79899 Other long term (current) drug therapy: Secondary | ICD-10-CM | POA: Diagnosis not present

## 2017-01-03 DIAGNOSIS — M450 Ankylosing spondylitis of multiple sites in spine: Secondary | ICD-10-CM | POA: Diagnosis not present

## 2017-02-21 DIAGNOSIS — M450 Ankylosing spondylitis of multiple sites in spine: Secondary | ICD-10-CM | POA: Diagnosis not present

## 2017-02-22 DIAGNOSIS — Z23 Encounter for immunization: Secondary | ICD-10-CM | POA: Diagnosis not present

## 2017-04-04 DIAGNOSIS — M450 Ankylosing spondylitis of multiple sites in spine: Secondary | ICD-10-CM | POA: Diagnosis not present

## 2017-04-17 DIAGNOSIS — J019 Acute sinusitis, unspecified: Secondary | ICD-10-CM | POA: Diagnosis not present

## 2017-04-25 DIAGNOSIS — M450 Ankylosing spondylitis of multiple sites in spine: Secondary | ICD-10-CM | POA: Diagnosis not present

## 2017-04-25 DIAGNOSIS — H2011 Chronic iridocyclitis, right eye: Secondary | ICD-10-CM | POA: Diagnosis not present

## 2017-04-25 DIAGNOSIS — M545 Low back pain: Secondary | ICD-10-CM | POA: Diagnosis not present

## 2017-05-16 DIAGNOSIS — M450 Ankylosing spondylitis of multiple sites in spine: Secondary | ICD-10-CM | POA: Diagnosis not present

## 2017-06-20 DIAGNOSIS — Z1231 Encounter for screening mammogram for malignant neoplasm of breast: Secondary | ICD-10-CM | POA: Diagnosis not present

## 2017-06-27 DIAGNOSIS — M450 Ankylosing spondylitis of multiple sites in spine: Secondary | ICD-10-CM | POA: Diagnosis not present

## 2017-06-27 DIAGNOSIS — Z79899 Other long term (current) drug therapy: Secondary | ICD-10-CM | POA: Diagnosis not present

## 2017-07-11 DIAGNOSIS — Z0000 Encounter for general adult medical examination without abnormal findings: Secondary | ICD-10-CM | POA: Diagnosis not present

## 2017-07-11 DIAGNOSIS — R82998 Other abnormal findings in urine: Secondary | ICD-10-CM | POA: Diagnosis not present

## 2017-07-18 DIAGNOSIS — R002 Palpitations: Secondary | ICD-10-CM | POA: Diagnosis not present

## 2017-07-18 DIAGNOSIS — Z23 Encounter for immunization: Secondary | ICD-10-CM | POA: Diagnosis not present

## 2017-07-18 DIAGNOSIS — M459 Ankylosing spondylitis of unspecified sites in spine: Secondary | ICD-10-CM | POA: Diagnosis not present

## 2017-07-18 DIAGNOSIS — Z1389 Encounter for screening for other disorder: Secondary | ICD-10-CM | POA: Diagnosis not present

## 2017-07-18 DIAGNOSIS — Z0000 Encounter for general adult medical examination without abnormal findings: Secondary | ICD-10-CM | POA: Diagnosis not present

## 2017-08-15 DIAGNOSIS — M450 Ankylosing spondylitis of multiple sites in spine: Secondary | ICD-10-CM | POA: Diagnosis not present

## 2017-09-26 DIAGNOSIS — M450 Ankylosing spondylitis of multiple sites in spine: Secondary | ICD-10-CM | POA: Diagnosis not present

## 2017-10-03 DIAGNOSIS — Z01419 Encounter for gynecological examination (general) (routine) without abnormal findings: Secondary | ICD-10-CM | POA: Diagnosis not present

## 2017-10-03 DIAGNOSIS — Z130 Encounter for screening for diseases of the blood and blood-forming organs and certain disorders involving the immune mechanism: Secondary | ICD-10-CM | POA: Diagnosis not present

## 2017-10-03 DIAGNOSIS — Z1389 Encounter for screening for other disorder: Secondary | ICD-10-CM | POA: Diagnosis not present

## 2017-11-07 DIAGNOSIS — M450 Ankylosing spondylitis of multiple sites in spine: Secondary | ICD-10-CM | POA: Diagnosis not present

## 2017-12-08 DIAGNOSIS — H2011 Chronic iridocyclitis, right eye: Secondary | ICD-10-CM | POA: Diagnosis not present

## 2017-12-08 DIAGNOSIS — M450 Ankylosing spondylitis of multiple sites in spine: Secondary | ICD-10-CM | POA: Diagnosis not present

## 2017-12-08 DIAGNOSIS — M545 Low back pain: Secondary | ICD-10-CM | POA: Diagnosis not present

## 2018-01-02 DIAGNOSIS — M450 Ankylosing spondylitis of multiple sites in spine: Secondary | ICD-10-CM | POA: Diagnosis not present

## 2018-10-10 ENCOUNTER — Other Ambulatory Visit: Payer: Self-pay

## 2018-10-10 ENCOUNTER — Emergency Department (HOSPITAL_BASED_OUTPATIENT_CLINIC_OR_DEPARTMENT_OTHER): Payer: 59

## 2018-10-10 ENCOUNTER — Encounter (HOSPITAL_BASED_OUTPATIENT_CLINIC_OR_DEPARTMENT_OTHER): Payer: Self-pay

## 2018-10-10 ENCOUNTER — Emergency Department (HOSPITAL_BASED_OUTPATIENT_CLINIC_OR_DEPARTMENT_OTHER)
Admission: EM | Admit: 2018-10-10 | Discharge: 2018-10-10 | Disposition: A | Discharge status: Home / Self Care | Payer: 59 | Attending: Emergency Medicine | Admitting: Emergency Medicine

## 2018-10-10 DIAGNOSIS — Y999 Unspecified external cause status: Secondary | ICD-10-CM | POA: Insufficient documentation

## 2018-10-10 DIAGNOSIS — W1789XA Other fall from one level to another, initial encounter: Secondary | ICD-10-CM | POA: Diagnosis not present

## 2018-10-10 DIAGNOSIS — Y9301 Activity, walking, marching and hiking: Secondary | ICD-10-CM | POA: Insufficient documentation

## 2018-10-10 DIAGNOSIS — Y929 Unspecified place or not applicable: Secondary | ICD-10-CM | POA: Diagnosis not present

## 2018-10-10 DIAGNOSIS — S82831A Other fracture of upper and lower end of right fibula, initial encounter for closed fracture: Secondary | ICD-10-CM

## 2018-10-10 DIAGNOSIS — Z79899 Other long term (current) drug therapy: Secondary | ICD-10-CM | POA: Insufficient documentation

## 2018-10-10 DIAGNOSIS — S95901A Unspecified injury of unspecified blood vessel at ankle and foot level, right leg, initial encounter: Secondary | ICD-10-CM | POA: Diagnosis present

## 2018-10-10 MED ORDER — OXYCODONE-ACETAMINOPHEN 5-325 MG PO TABS: 1.0000 | ORAL_TABLET | Freq: Four times a day (QID) | ORAL | 0 refills | Status: DC | PRN

## 2018-10-10 MED ORDER — OXYCODONE-ACETAMINOPHEN 5-325 MG PO TABS
1.0000 | ORAL_TABLET | Freq: Once | ORAL | Status: AC
  Administered 2018-10-10: 1 via ORAL
  Filled 2018-10-10: qty 1

## 2018-10-10 NOTE — Discharge Instructions (Signed)
You were seen in the ED today for right ankle pain; your xray showed a fibular fracture. Please follow up with Dr. Barbaraann Barthel with orthopedics for further management; you will need to call their office to schedule an appointment. Please take narcotic pain medication as needed for severe pain. You may take Ibuprofen for pain as well. Please elevate your leg for comfort while you are home.

## 2018-10-10 NOTE — ED Triage Notes (Addendum)
Pt states she "rolled her ankle" today-pain to right ankle-to triage in w/c-NAD

## 2018-10-10 NOTE — ED Provider Notes (Signed)
[facility] Provider Note   CSN: [id number] Arrival date & time: 10/10/18  2039    History   Chief Complaint Chief Complaint  Patient presents with  . Ankle Injury    HPI Alice Moore is a 58 y.o. female who presents to the ED today complaining of sudden onset, constant, achy, 10/10, right ankle pain that began approximately 2.5 hours ago. Pt reports that she accidentally stepped into a hole and twisted her ankle, causing immediate pain to the ankle. Pt reports she is unable to bear weight onto her ankle due to the pain. She has not taken anything PTA. No previous injury to that ankle. Denies wound, weakness, numbness, tingling.        Past Medical History:  Diagnosis Date  . AS (ankylosing spondylitis) [facility])     Patient Active Problem List   Diagnosis Date Noted  . 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 TONSILLECTOMY       OB History   No obstetric history on file.      Home Medications    Prior to Admission medications   Medication Sig Start Date End Date Taking? Authorizing Provider  Celecoxib (CELEBREX PO) Take by mouth.    [provider]  Cetirizine HCl (ZYRTEC PO) Take by mouth.    [provider]  estradiol (VIVELLE-DOT) 0.05 MG/24HR patch Place 1 patch onto the skin 2 (two) times a week.    [provider]  fluticasone (FLONASE) 50 MCG/ACT nasal spray Place into both nostrils daily.    [provider]  InFLIXimab (REMICADE IV) Inject into the vein.    [provider]  oxyCODONE-acetaminophen (PERCOCET/ROXICET) 5-325 MG tablet Take 1 tablet by mouth every 6 (six) hours as needed for severe pain. 10/10/18   Eustaquio Maize, PA-C  Progesterone Micronized (PROGESTERONE PO) Take by mouth.    [provider]    Family History Family History  Problem Relation Age of Onset  . Cancer Mother   . Stroke Father   . Heart  failure Father   . Cancer Sister     Social History Social History   Tobacco Use  . Smoking status: Never Smoker  . Smokeless tobacco: Never Used  Substance Use Topics  . Alcohol use: Yes    Comment: occ  . Drug use: No     Allergies   Patient has no known allergies.   Review of Systems Review of Systems  Constitutional: Negative for fever.  Musculoskeletal: Positive for arthralgias and joint swelling.  Skin: Negative for wound.     Physical Exam Updated Vital Signs BP 138/84 (BP Location: Left Arm)   Pulse 75   Temp 98.5 F (36.9 C) (Oral)   Resp 20   Ht 5\' 7"  (1.702 m)   Wt 81.6 kg   SpO2 100%   BMI 28.19 kg/m   Physical Exam Vitals signs and nursing note reviewed.  Constitutional:      Appearance: She is not ill-appearing.  HENT:     Head: Normocephalic and atraumatic.  Eyes:     Conjunctiva/sclera: Conjunctivae normal.  Cardiovascular:     Rate and Rhythm: Normal rate and regular rhythm.  Pulmonary:     Effort: Pulmonary effort is normal.     Breath sounds: Normal breath sounds.  Musculoskeletal:     Comments: Mild swelling to right ankle with TTP along medial and lateral malleolus. ROM limited due to pain. No  tenderness to dorsum of foot or right knee. Cap refill < 2 seconds. Strength and sensation intact throughout. 2+ DP pulse.   Skin:    General: Skin is warm and dry.     Coloration: Skin is not jaundiced.  Neurological:     Mental Status: She is alert.      ED Treatments / Results  Labs (all labs ordered are listed, but only abnormal results are displayed) Labs Reviewed - No data to display  EKG None  Radiology Dg Ankle Complete Right  Result Date: 10/10/2018 CLINICAL DATA:  Pain EXAM: RIGHT ANKLE - COMPLETE 3+ VIEW COMPARISON:  None. FINDINGS: There is an acute mildly displaced fracture involving the distal aspect of the fibula. There is surrounding soft tissue swelling. An ankle joint effusion is present. IMPRESSION: Acute mildly  displaced fracture involving the distal aspect of the fibula. Electronically Signed   By: Katherine Mantlehristopher  Green M.D.   On: 10/10/2018 21:13    Procedures Procedures (including critical care time)  Medications Ordered in ED Medications  oxyCODONE-acetaminophen (PERCOCET/ROXICET) 5-325 MG per tablet 1 tablet (1 tablet Oral Given 10/10/18 2201)     Initial Impression / Assessment and Plan / ED Course  I have reviewed the triage vital signs and the nursing notes.  Pertinent labs & imaging results that were available during my care of the patient were reviewed by me and considered in my medical decision making (see chart for details).    58 year old female presenting to the ED with right ankle pain after stepping in a hole and inverting ankle. Pt unable to bear weight due to pain. Ankle minimally swollen on exam with exquisite TTP with light tough to lateral malleolus. Xray obtained prior to patient being seen; does show distal fibular fracture that is mildly displaced. Will place patient in a short leg splint and give crutches. Will have her follow up with orthopedics. Narcotic pain medication to be prescribed as well. PMDP reviewed without issue. Pt is in agreement with plan at this time and stable for discharge home.        Final Clinical Impressions(s) / ED Diagnoses   Final diagnoses:  Other closed fracture of distal end of right fibula, initial encounter    ED Discharge Orders         Ordered    oxyCODONE-acetaminophen (PERCOCET/ROXICET) 5-325 MG tablet  Every 6 hours PRN     10/10/18 2136           Tanda RockersVenter, Emannuel Vise, PA-C 10/10/18 2205    Loren RacerYelverton, David, MD 10/11/18 1521

## 2018-10-11 ENCOUNTER — Encounter: Payer: Self-pay | Admitting: Family Medicine

## 2018-10-11 ENCOUNTER — Ambulatory Visit: Payer: 59 | Admitting: Family Medicine

## 2018-10-11 DIAGNOSIS — S82831A Other fracture of upper and lower end of right fibula, initial encounter for closed fracture: Secondary | ICD-10-CM | POA: Diagnosis not present

## 2018-10-11 DIAGNOSIS — S82839A Other fracture of upper and lower end of unspecified fibula, initial encounter for closed fracture: Secondary | ICD-10-CM | POA: Insufficient documentation

## 2018-10-11 NOTE — Progress Notes (Signed)
Alice Moore - 58 y.o. female MRN [id number]  Date of birth: [birth year]  SUBJECTIVE:  Including CC & ROS.  Chief Complaint  Patient presents with  . Ankle Injury    right ankle x 10-10-2018    Alice Moore is a 58 y.o. female that is  Presenting with acute right ankle pain.  She was working in her garden last night and had an inversion injury.  She had significant pain over the lateral malleolus with swelling and inability to walk.  She was diagnosed with a fracture in the emergency department.  She was placed in the splint and has had improvement of her pain and swelling.  Still having pain over the distal lateral malleolus.  The pain is throbbing in nature.  She does have some swelling today.  Rates the pain is moderate to severe.  It is worse with walking.  She has been using crutches.  Denies any numbness or tingling..  Independent review of the right ankle x-ray from 6/23 shows acute mildly displaced fracture of the distal fibula.   Review of Systems  Constitutional: Negative for fever.  HENT: Negative for congestion.   Respiratory: Negative for cough.   Cardiovascular: Negative for chest pain.  Gastrointestinal: Negative for abdominal pain.  Musculoskeletal: Positive for arthralgias and gait problem.  Neurological: Negative for weakness.  Hematological: Negative for adenopathy.  Psychiatric/Behavioral: Negative for agitation.    HISTORY: Past Medical, Surgical, Social, and Family History Reviewed & Updated per EMR.   Pertinent Historical Findings include:  Past Medical History:  Diagnosis Date  . AS (ankylosing spondylitis) ([facility])     Past Surgical History:  Procedure Laterality Date  . TONSILLECTOMY      No Known Allergies  Family History  Problem Relation Age of Onset  . Cancer Mother   . Stroke Father   . Heart failure Father   . Cancer Sister      Social History   Socioeconomic History  . Marital status: Married    Spouse name: Not on file  .  Number of children: Not on file  . Years of education: Not on file  . Highest education level: Not on file  Occupational History  . Not on file  Social Needs  . Financial resource strain: Not on file  . Food insecurity    Worry: Not on file    Inability: Not on file  . Transportation needs    Medical: Not on file    Non-medical: Not on file  Tobacco Use  . Smoking status: Never Smoker  . Smokeless tobacco: Never Used  Substance and Sexual Activity  . Alcohol use: Yes    Comment: occ  . Drug use: No  . Sexual activity: Not on file  Lifestyle  . Physical activity    Days per week: Not on file    Minutes per session: Not on file  . Stress: Not on file  Relationships  . Social Herbalist on phone: Not on file    Gets together: Not on file    Attends religious service: Not on file    Active member of club or organization: Not on file    Attends meetings of clubs or organizations: Not on file    Relationship status: Not on file  . Intimate partner violence    Fear of current or ex partner: Not on file    Emotionally abused: Not on file    Physically abused: Not on file  Forced sexual activity: Not on file  Other Topics Concern  . Not on file  Social History Narrative  . Not on file     PHYSICAL EXAM:  VS: Ht 5\' 7"  (1.702 m)   Wt 180 lb (81.6 kg)   BMI 28.19 kg/m  Physical Exam Gen: NAD, alert, cooperative with exam, well-appearing ENT: normal lips, normal nasal mucosa,  Eye: normal EOM, normal conjunctiva and lids CV:  no edema, +2 pedal pulses   Resp: no accessory muscle use, non-labored,  Skin: no rashes, no areas of induration  Neuro: normal tone, normal sensation to touch Psych:  normal insight, alert and oriented MSK:  Right ankle:  TTP of the right lateral malleolus  Limited ROM due to pain  Swelling of the lateral malleolus  Neurovascularly intact      ASSESSMENT & PLAN:   Closed avulsion fracture of distal fibula Inversion injury.  Having some pain with ambulation  - placed in CAM walker.  - counseled on supportive care - f/u in one week. Repeat imaging

## 2018-10-11 NOTE — Patient Instructions (Signed)
Nice to meet you Please use ice and elevation  Please do this during your work day and when you get home at night   Please try vitamin K2 to help with healing  Please use tylenol    Please send me a message in [place] with any questions or updates.  Please see me back in 1 week.   --Dr. Raeford Razor

## 2018-10-11 NOTE — Assessment & Plan Note (Signed)
Inversion injury. Having some pain with ambulation  - placed in CAM walker.  - counseled on supportive care - f/u in one week. Repeat imaging

## 2018-10-18 ENCOUNTER — Ambulatory Visit: Payer: 59 | Admitting: Family Medicine

## 2018-10-18 ENCOUNTER — Other Ambulatory Visit: Payer: Self-pay

## 2018-10-18 ENCOUNTER — Encounter: Payer: Self-pay | Admitting: Family Medicine

## 2018-10-18 ENCOUNTER — Ambulatory Visit: Payer: Self-pay

## 2018-10-18 VITALS — BP 139/88 | HR 64 | Ht 67.0 in | Wt 180.0 lb

## 2018-10-18 DIAGNOSIS — S82831D Other fracture of upper and lower end of right fibula, subsequent encounter for closed fracture with routine healing: Secondary | ICD-10-CM | POA: Diagnosis not present

## 2018-10-18 NOTE — Patient Instructions (Signed)
Good to see you Please continue the vitamin K2  I will call you about the vitamin D  Use the CAM walker one more week. Then try to transition into the lace up brace.  Please continue ice.  Please try the range of motion for the next week. Start the strengthening exercises in 2 weeks   Please send me a message in MyChart with any questions or updates.  Please see me back in 3 weeks.   --Dr. Raeford Razor

## 2018-10-18 NOTE — Progress Notes (Signed)
Alice Moore - 58 y.o. female MRN [id number]  Date of birth: 15-Nov-1960  SUBJECTIVE:  Including CC & ROS.  Chief Complaint  Patient presents with  . Follow-up    follow up for right ankle    Alice Moore is a 58 y.o. female that is following up for her right distal fibula fracture.  Her pain is significantly improved with using the cam walker.  She still has some tenderness and swelling distal to the fibula.  Has been icing and notices improvement of the pain.    Review of Systems  Constitutional: Negative for fever.  HENT: Negative for congestion.   Respiratory: Negative for cough.   Cardiovascular: Negative for chest pain.  Gastrointestinal: Negative for abdominal pain.  Musculoskeletal: Positive for gait problem.  Skin: Positive for color change.  Neurological: Negative for numbness.  Hematological: Negative for adenopathy.    HISTORY: Past Medical, Surgical, Social, and Family History Reviewed & Updated per EMR.   Pertinent Historical Findings include:  Past Medical History:  Diagnosis Date  . AS (ankylosing spondylitis) (HCC)     Past Surgical History:  Procedure Laterality Date  . TONSILLECTOMY      No Known Allergies  Family History  Problem Relation Age of Onset  . Cancer Mother   . Stroke Father   . Heart failure Father   . Cancer Sister      Social History   Socioeconomic History  . Marital status: Married    Spouse name: Not on file  . Number of children: Not on file  . Years of education: Not on file  . Highest education level: Not on file  Occupational History  . Not on file  Social Needs  . Financial resource strain: Not on file  . Food insecurity    Worry: Not on file    Inability: Not on file  . Transportation needs    Medical: Not on file    Non-medical: Not on file  Tobacco Use  . Smoking status: Never Smoker  . Smokeless tobacco: Never Used  Substance and Sexual Activity  . Alcohol use: Yes    Comment: occ  . Drug use:  No  . Sexual activity: Not on file  Lifestyle  . Physical activity    Days per week: Not on file    Minutes per session: Not on file  . Stress: Not on file  Relationships  . Social Herbalist on phone: Not on file    Gets together: Not on file    Attends religious service: Not on file    Active member of club or organization: Not on file    Attends meetings of clubs or organizations: Not on file    Relationship status: Not on file  . Intimate partner violence    Fear of current or ex partner: Not on file    Emotionally abused: Not on file    Physically abused: Not on file    Forced sexual activity: Not on file  Other Topics Concern  . Not on file  Social History Narrative  . Not on file     PHYSICAL EXAM:  VS: BP 139/88   Pulse 64   Ht 5\' 7"  (1.702 m)   Wt 180 lb (81.6 kg)   BMI 28.19 kg/m  Physical Exam Gen: NAD, alert, cooperative with exam, well-appearing ENT: normal lips, normal nasal mucosa,  Eye: normal EOM, normal conjunctiva and lids CV:  no edema, +2 pedal pulses  Resp: no accessory muscle use, non-labored,  Skin: no rashes, no areas of induration  Neuro: normal tone, normal sensation to touch Psych:  normal insight, alert and oriented MSK:  Right ankle:  Ecchymosis and swelling over the lateral malleolus and distal to this area. Normal strength to resistance with eversion. Normal range of motion. Tenderness to palpation over the ATFL and distal fibula. Negative anterior drawer. Neurovascular intact  Limited ultrasound: Right ankle:  Irregularity in the cortex to identify the distal fibular fracture.  There is healing in place with no significant hypervascularity. Significant effusion that is encircling the peroneal tendons.  There is no increased vascularity of this area.  Possible effusion to suggest rupture of the retinaculum.  The effusion begins just proximal to the lateral malleolus and extends down to the base of the fifth. ATFL  appears to be intact.  Summary: Routine healing of the fracture with the fusion of the peroneal tendons.  Ultrasound and interpretation by Clare GandyJeremy Agata Lucente, MD      ASSESSMENT & PLAN:   Closed avulsion fracture of distal fibula Has had improvement with her pain.  Fracture is showing routine healing.  Does have a large effusion so encircling the peroneal tendons.  Unsure if this is related to a retinacular rupture. -Continue the cam walker for 1 more week.  She can try transitioning to a lace up brace at that time. -Counseled on home exercise therapy and supportive care. -Vitamin D. -Can follow-up in 3 weeks.

## 2018-10-18 NOTE — Assessment & Plan Note (Signed)
Has had improvement with her pain.  Fracture is showing routine healing.  Does have a large effusion so encircling the peroneal tendons.  Unsure if this is related to a retinacular rupture. -Continue the cam walker for 1 more week.  She can try transitioning to a lace up brace at that time. -Counseled on home exercise therapy and supportive care. -Vitamin D. -Can follow-up in 3 weeks.

## 2018-10-19 LAB — VITAMIN D 25 HYDROXY (VIT D DEFICIENCY, FRACTURES): Vit D, 25-Hydroxy: 40 ng/mL (ref 30.0–100.0)

## 2018-11-06 ENCOUNTER — Other Ambulatory Visit: Payer: Self-pay

## 2018-11-06 ENCOUNTER — Ambulatory Visit: Payer: 59 | Admitting: Family Medicine

## 2018-11-06 ENCOUNTER — Encounter: Payer: Self-pay | Admitting: Family Medicine

## 2018-11-06 DIAGNOSIS — S82831D Other fracture of upper and lower end of right fibula, subsequent encounter for closed fracture with routine healing: Secondary | ICD-10-CM | POA: Diagnosis not present

## 2018-11-06 NOTE — Assessment & Plan Note (Signed)
Reports she is feeling better.  Does have some pain while going downstairs and some instability with one leg standing. -Try to wean out of the lace up ankle brace over the next 2 weeks. -Counseled on home exercise therapy and supportive care. -Can take Celebrex if needed. -Can follow-up as needed

## 2018-11-06 NOTE — Patient Instructions (Signed)
Good to see you Please try to wean out of the lace up ankle brace over the next two weeks. You may still need it at the end of the day  Please try ice as needed  Please use celebrex if there is pain   Please send me a message in MyChart with any questions or updates.  Please see me back in 2 months or in a virtual visit.   --Dr. Raeford Razor

## 2018-11-06 NOTE — Progress Notes (Signed)
Alice LevansSandra Lynn Moore - 58 y.o. female MRN [id number]  Date of birth: 06/01/1960  SUBJECTIVE:  Including CC & ROS.  Chief Complaint  Patient presents with  . Follow-up    follow up for right ankle    Alice Moore is a 58 y.o. female that is following up for right ankle avulsion fracture.  Initially occurred on 6/26.  Has been performing ankle exercises and feels she has had improvement.  Still feels some swelling over the peroneal tendons.  Has some trouble going down stairs.  Feels normal when she is standing.   Review of Systems  Constitutional: Negative for fever.  HENT: Negative for congestion.   Respiratory: Negative for cough.   Cardiovascular: Negative for chest pain.  Gastrointestinal: Negative for abdominal pain.  Musculoskeletal: Positive for gait problem.  Skin: Negative for color change.  Neurological: Negative for weakness.  Hematological: Negative for adenopathy.    HISTORY: Past Medical, Surgical, Social, and Family History Reviewed & Updated per EMR.   Pertinent Historical Findings include:  Past Medical History:  Diagnosis Date  . AS (ankylosing spondylitis) (HCC)     Past Surgical History:  Procedure Laterality Date  . TONSILLECTOMY      No Known Allergies  Family History  Problem Relation Age of Onset  . Cancer Mother   . Stroke Father   . Heart failure Father   . Cancer Sister      Social History   Socioeconomic History  . Marital status: Married    Spouse name: Not on file  . Number of children: Not on file  . Years of education: Not on file  . Highest education level: Not on file  Occupational History  . Not on file  Social Needs  . Financial resource strain: Not on file  . Food insecurity    Worry: Not on file    Inability: Not on file  . Transportation needs    Medical: Not on file    Non-medical: Not on file  Tobacco Use  . Smoking status: Never Smoker  . Smokeless tobacco: Never Used  Substance and Sexual Activity  . Alcohol  use: Yes    Comment: occ  . Drug use: No  . Sexual activity: Not on file  Lifestyle  . Physical activity    Days per week: Not on file    Minutes per session: Not on file  . Stress: Not on file  Relationships  . Social Musicianconnections    Talks on phone: Not on file    Gets together: Not on file    Attends religious service: Not on file    Active member of club or organization: Not on file    Attends meetings of clubs or organizations: Not on file    Relationship status: Not on file  . Intimate partner violence    Fear of current or ex partner: Not on file    Emotionally abused: Not on file    Physically abused: Not on file    Forced sexual activity: Not on file  Other Topics Concern  . Not on file  Social History Narrative  . Not on file     PHYSICAL EXAM:  VS: BP (!) 133/91   Pulse 62   Ht 5\' 3"  (1.6 m)   Wt 180 lb (81.6 kg)   BMI 31.89 kg/m  Physical Exam Gen: NAD, alert, cooperative with exam, well-appearing ENT: normal lips, normal nasal mucosa,  Eye: normal EOM, normal conjunctiva and lids CV:  no edema, +2 pedal pulses   Resp: no accessory muscle use, non-labored,   Skin: no rashes, no areas of induration  Neuro: normal tone, normal sensation to touch Psych:  normal insight, alert and oriented MSK:  Right ankle: Swelling and tenderness over the peroneal tendons. Tenderness to palpation over the ATFL. Normal range of motion. Instability with one leg standing on the right compared to the left. Neurovascular intact     ASSESSMENT & PLAN:   Closed avulsion fracture of distal fibula Reports she is feeling better.  Does have some pain while going downstairs and some instability with one leg standing. -Try to wean out of the lace up ankle brace over the next 2 weeks. -Counseled on home exercise therapy and supportive care. -Can take Celebrex if needed. -Can follow-up as needed

## 2018-12-04 ENCOUNTER — Other Ambulatory Visit: Payer: Self-pay

## 2018-12-04 DIAGNOSIS — Z20822 Contact with and (suspected) exposure to covid-19: Secondary | ICD-10-CM

## 2018-12-05 LAB — NOVEL CORONAVIRUS, NAA: SARS-CoV-2, NAA: NOT DETECTED

## 2020-01-09 IMAGING — DX RIGHT ANKLE - COMPLETE 3+ VIEW
3 series · 3 of 3 positions shown · non-contrast
Comparison: None.

CLINICAL DATA: Pain

EXAM:
RIGHT ANKLE - COMPLETE 3+ VIEW

[ankle ap]
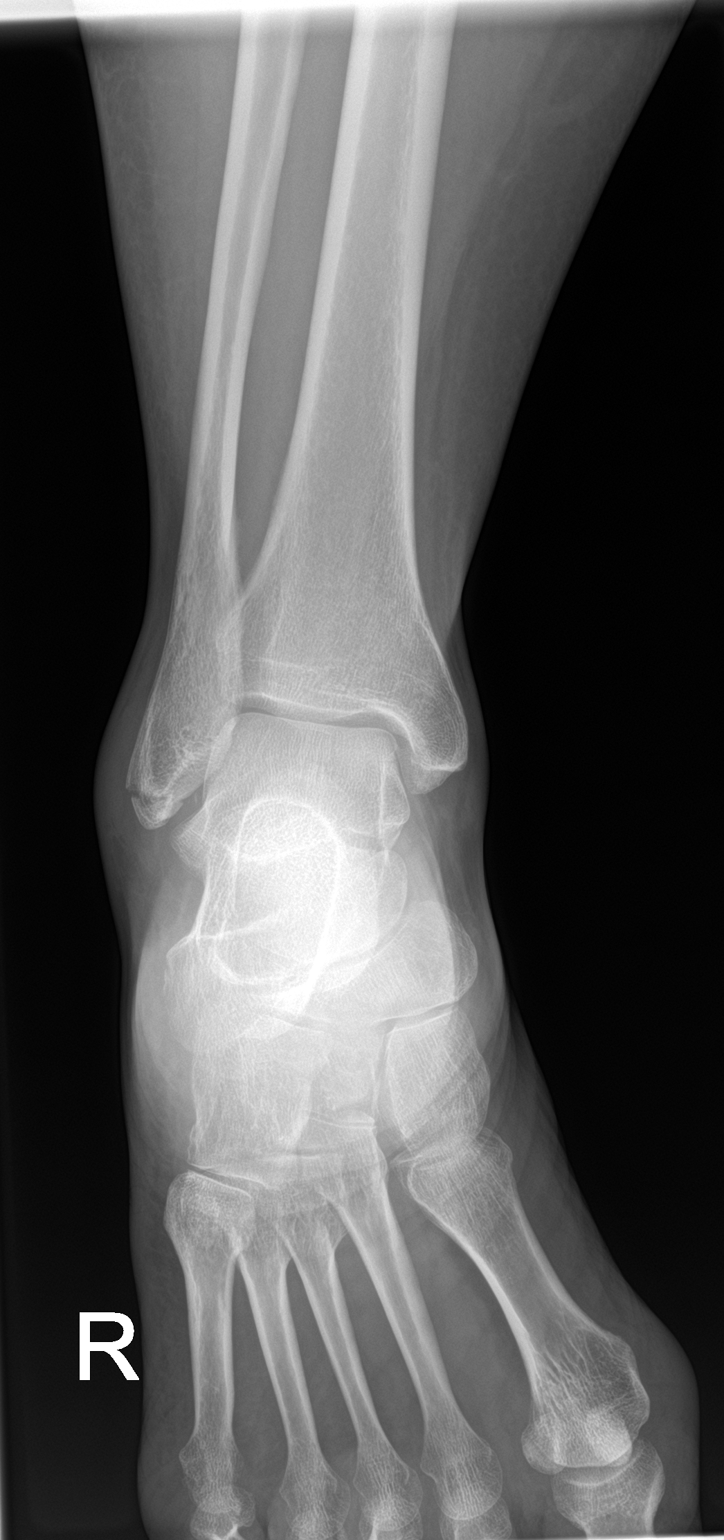

[ankle lat]
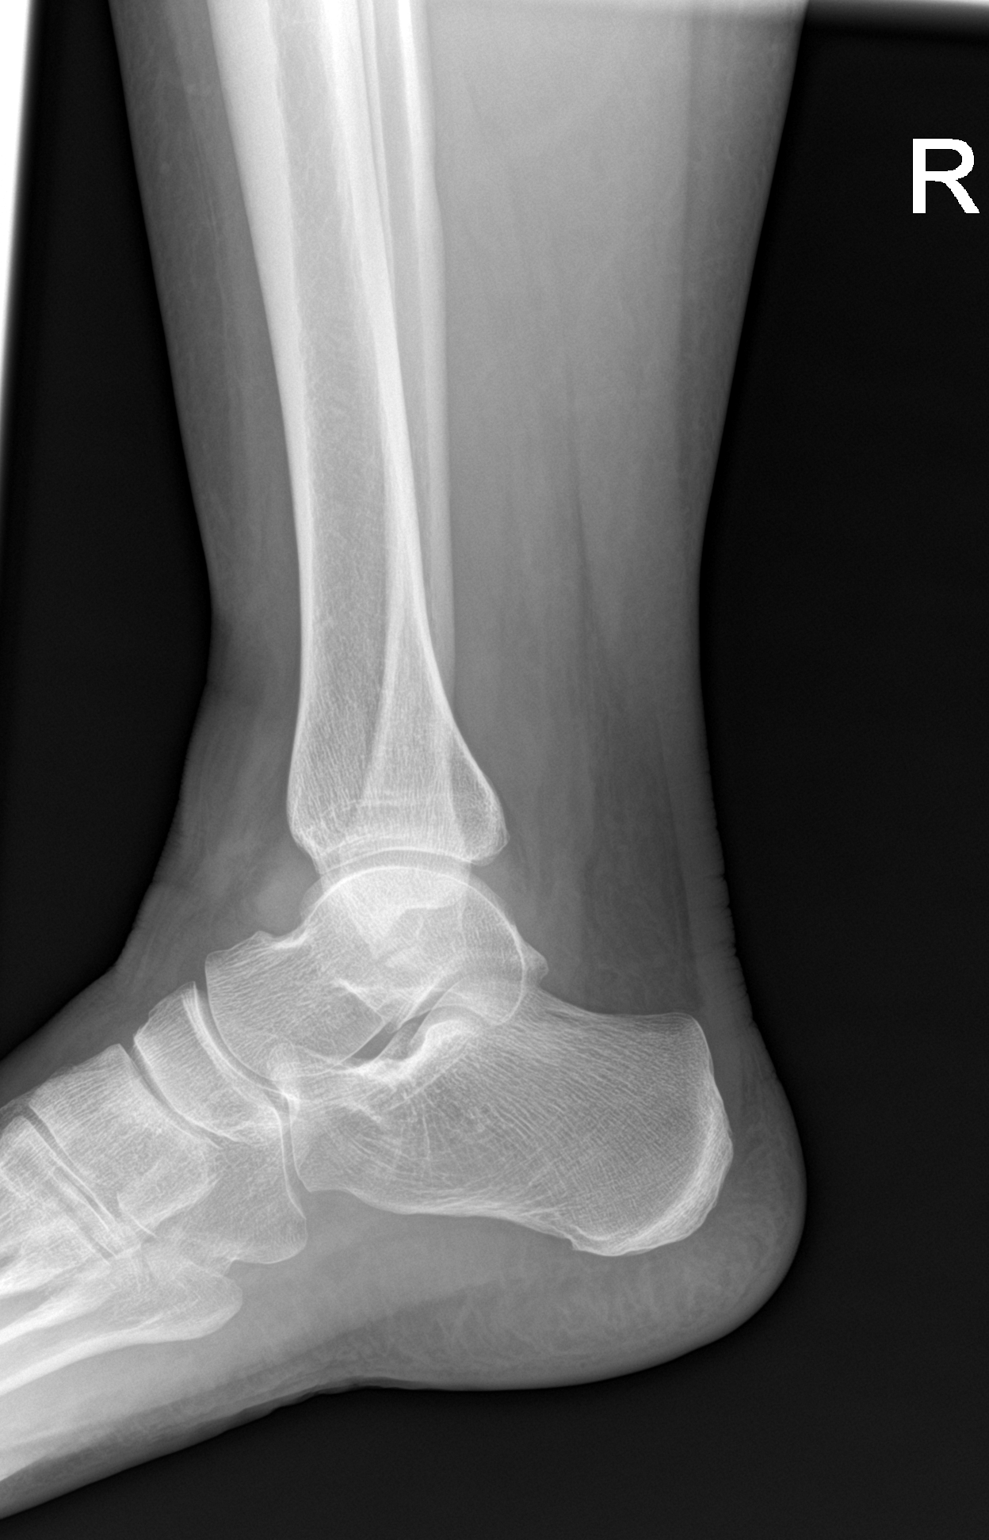

[ankle obl]
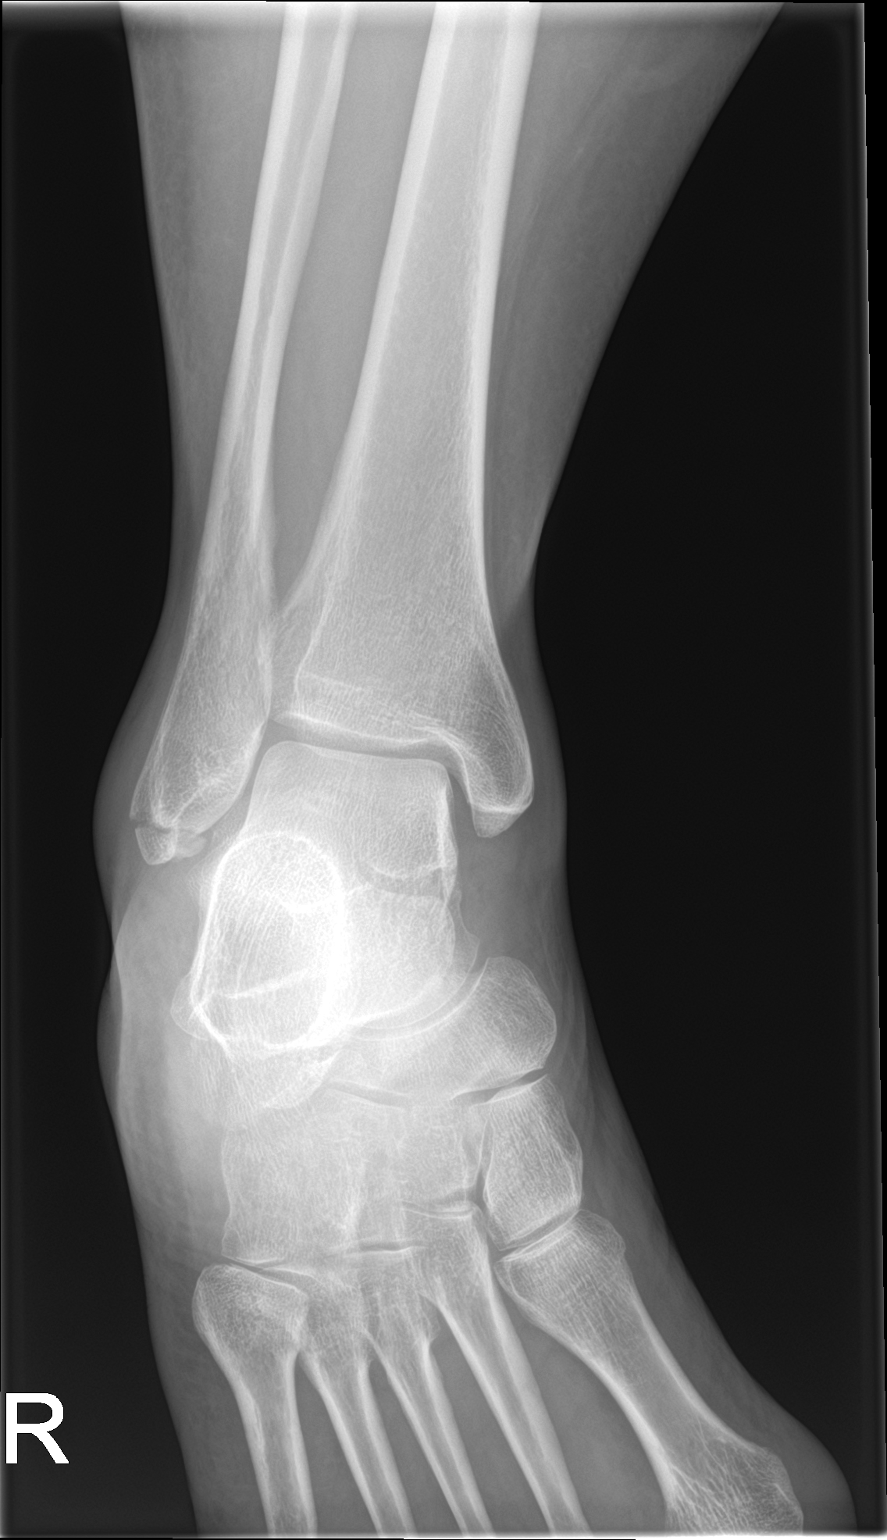

[3 of 3 positions shown; findings below may reference images not displayed]

FINDINGS: There is an acute mildly displaced fracture involving the distal
aspect of the fibula. There is surrounding soft tissue swelling. An
ankle joint effusion is present.
IMPRESSION: Acute mildly displaced fracture involving the distal aspect of the
fibula.

## 2021-01-06 ENCOUNTER — Other Ambulatory Visit: Payer: Self-pay

## 2021-01-06 ENCOUNTER — Emergency Department (HOSPITAL_BASED_OUTPATIENT_CLINIC_OR_DEPARTMENT_OTHER)
Admission: EM | Admit: 2021-01-06 | Discharge: 2021-01-06 | Disposition: A | Discharge status: Home / Self Care | Payer: 59 | Attending: Emergency Medicine | Admitting: Emergency Medicine

## 2021-01-06 ENCOUNTER — Encounter (HOSPITAL_BASED_OUTPATIENT_CLINIC_OR_DEPARTMENT_OTHER): Payer: Self-pay | Admitting: *Deleted

## 2021-01-06 DIAGNOSIS — R42 Dizziness and giddiness: Secondary | ICD-10-CM | POA: Insufficient documentation

## 2021-01-06 DIAGNOSIS — K921 Melena: Secondary | ICD-10-CM | POA: Insufficient documentation

## 2021-01-06 DIAGNOSIS — R197 Diarrhea, unspecified: Secondary | ICD-10-CM

## 2021-01-06 DIAGNOSIS — R1031 Right lower quadrant pain: Secondary | ICD-10-CM | POA: Diagnosis not present

## 2021-01-06 LAB — COMPREHENSIVE METABOLIC PANEL
ALT: 11 U/L (ref 0–44)
AST: 21 U/L (ref 15–41)
Albumin: 4.7 g/dL (ref 3.5–5.0)
Alkaline Phosphatase: 67 U/L (ref 38–126)
Anion gap: 14 (ref 5–15)
BUN: 17 mg/dL (ref 6–20)
CO2: 23 mmol/L (ref 22–32)
Calcium: 10 mg/dL (ref 8.9–10.3)
Chloride: 101 mmol/L (ref 98–111)
Creatinine, Ser: 0.77 mg/dL (ref 0.44–1.00)
GFR, Estimated: >60 mL/min (ref >60)
Glucose, Bld: 100 mg/dL — ABNORMAL HIGH (ref 70–99)
Potassium: 3.4 mmol/L — ABNORMAL LOW (ref 3.5–5.1)
Sodium: 138 mmol/L (ref 135–145)
Total Bilirubin: 0.6 mg/dL (ref 0.3–1.2)
Total Protein: 7.7 g/dL (ref 6.5–8.1)

## 2021-01-06 LAB — GASTROINTESTINAL PANEL BY PCR, STOOL (REPLACES STOOL CULTURE)

## 2021-01-06 LAB — DIFFERENTIAL
Abs Immature Granulocytes: 0.04 10*3/uL (ref 0.00–0.07)
Basophils Absolute: 0 10*3/uL (ref 0.0–0.1)
Basophils Relative: 0 %
Eosinophils Absolute: 0 10*3/uL (ref 0.0–0.5)
Eosinophils Relative: 0 %
Immature Granulocytes: 0 %
Lymphocytes Relative: 16 %
Lymphs Abs: 2.3 10*3/uL (ref 0.7–4.0)
Monocytes Absolute: 1.2 10*3/uL — ABNORMAL HIGH (ref 0.1–1.0)
Monocytes Relative: 8 %
Neutro Abs: 11 10*3/uL — ABNORMAL HIGH (ref 1.7–7.7)
Neutrophils Relative %: 76 %

## 2021-01-06 LAB — MAGNESIUM: Magnesium: 1.8 mg/dL (ref 1.7–2.4)

## 2021-01-06 LAB — URINALYSIS, ROUTINE W REFLEX MICROSCOPIC
Bilirubin Urine: NEGATIVE
Glucose, UA: NEGATIVE mg/dL
Ketones, ur: >80 mg/dL — AB
Nitrite: NEGATIVE
Protein, ur: 30 mg/dL — AB
Specific Gravity, Urine: 1.039 — ABNORMAL HIGH (ref 1.005–1.030)
pH: 6 (ref 5.0–8.0)

## 2021-01-06 LAB — PREGNANCY, URINE: Preg Test, Ur: NEGATIVE

## 2021-01-06 LAB — CBC
HCT: 44 % (ref 36.0–46.0)
Hemoglobin: 14.8 g/dL (ref 12.0–15.0)
MCH: 29.7 pg (ref 26.0–34.0)
MCHC: 33.6 g/dL (ref 30.0–36.0)
MCV: 88.2 fL (ref 80.0–100.0)
Platelets: 374 10*3/uL (ref 150–400)
RBC: 4.99 MIL/uL (ref 3.87–5.11)
RDW: 11.9 % (ref 11.5–15.5)
WBC: 14.3 10*3/uL — ABNORMAL HIGH (ref 4.0–10.5)
nRBC: 0 % (ref 0.0–0.2)

## 2021-01-06 LAB — LIPASE, BLOOD: Lipase: 16 U/L (ref 11–51)

## 2021-01-06 MED ORDER — ONDANSETRON 8 MG PO TBDP: 8.0000 mg | ORAL_TABLET | Freq: Three times a day (TID) | ORAL | 0 refills | Status: DC | PRN

## 2021-01-06 MED ORDER — LACTATED RINGERS IV BOLUS
1000.0000 mL | Freq: Once | INTRAVENOUS | Status: AC
  Administered 2021-01-06: 1000 mL via INTRAVENOUS

## 2021-01-06 MED ORDER — AZITHROMYCIN 250 MG PO TABS: 500.0000 mg | ORAL_TABLET | Freq: Every day | ORAL | 0 refills | Status: DC

## 2021-01-06 MED ORDER — MORPHINE SULFATE (PF) 4 MG/ML IV SOLN
4.0000 mg | Freq: Once | INTRAVENOUS | Status: AC
  Administered 2021-01-06: 4 mg via INTRAVENOUS
  Filled 2021-01-06: qty 1

## 2021-01-06 MED ORDER — LOPERAMIDE HCL 2 MG PO CAPS: 2.0000 mg | ORAL_CAPSULE | Freq: Four times a day (QID) | ORAL | 0 refills | Status: DC | PRN

## 2021-01-06 NOTE — Discharge Instructions (Addendum)
We saw in the ER for abdominal discomfort and diarrhea.  Given that you are having bloody stools and cramping abdominal pain, suspicion is high for this being colitis secondary to bacterial infection.  Given that you are immunocompromised, we have agreed to start aggressive measures with antibiotics now.  Given that we still do not have a completely clear picture of the cause for your diarrhea, we recommend that he follow-up with your primary care doctor on Friday.  Return to the ER if your pain starts getting worse, start having fevers, chills, dizziness, lightheadedness.

## 2021-01-06 NOTE — ED Triage Notes (Signed)
Ate something with farm fresh eggs in it yesterday, about 4 hours later, abd cramping N/V today bloody stools.

## 2021-01-06 NOTE — ED Provider Notes (Addendum)
[facility] EMERGENCY DEPT Provider Note   CSN: [id number] Arrival date & time: 01/06/21  6144     History Chief Complaint  Patient presents with   Abdominal Pain    Alice Moore is a 60 y.o. female.  HPI    60 year old female with history of ankylosing spondylitis on Remicade comes in with chief complaint of abdominal pain and bloody stools.  Patient reports that around 8 AM yesterday she had farm fresh egg.  4 hours after she started having cramping abdominal pain followed by diarrhea.  Initially the diarrhea was loose and watery soon it turned bloody.  She had between 6-10 episodes of diarrhea yesterday, 3 episodes overnight.  She had about 3 episodes of emesis yesterday followed by nausea without vomiting today.  Review of system is positive for dizziness when getting up.  Patient is not on any blood thinners.  She denies any suspicious food intake the day before and there is nobody else sick in the house with similar symptoms.  Patient also denies any URI-like symptoms.  No history of heavy alcohol use, smoking or drug use.  There is some family history of colon cancer, patient's last colonoscopy was 3 years ago and normal.  Past Medical History:  Diagnosis Date   AS (ankylosing spondylitis) (HCC)     Patient Active Problem List   Diagnosis Date Noted   Closed avulsion fracture of distal fibula 10/11/2018   Nevus, non-neoplastic 07/07/2011    Past Surgical History:  Procedure Laterality Date   DILATION AND CURETTAGE OF UTERUS     TONSILLECTOMY     UTERINE FIBROID SURGERY N/A      OB History   No obstetric history on file.     Family History  Problem Relation Age of Onset   Cancer Mother    Stroke Father    Heart failure Father    Cancer Sister     Social History   Tobacco Use   Smoking status: Never   Smokeless tobacco: Never  Vaping Use   Vaping Use: Never used  Substance Use Topics   Alcohol use: Yes    Comment: occ   Drug  use: No    Home Medications Prior to Admission medications   Medication Sig Start Date End Date Taking? Authorizing Provider  azithromycin (ZITHROMAX) 250 MG tablet Take 2 tablets (500 mg total) by mouth daily. 01/06/21  Yes Cella Cappello, MD  Celecoxib (CELEBREX PO) Take by mouth.   Yes [provider]  Cetirizine HCl (ZYRTEC PO) Take by mouth.   Yes [provider]  fluticasone (FLONASE) 50 MCG/ACT nasal spray Place into both nostrils daily.   Yes [provider]  loperamide (IMODIUM) 2 MG capsule Take 1 capsule (2 mg total) by mouth 4 (four) times daily as needed for diarrhea or loose stools. 01/06/21  Yes Alima Naser, MD  ondansetron (ZOFRAN ODT) 8 MG disintegrating tablet Take 1 tablet (8 mg total) by mouth every 8 (eight) hours as needed for nausea. 01/06/21  Yes Kenedi Cilia, Janey Genta, MD  estradiol (VIVELLE-DOT) 0.05 MG/24HR patch Place 1 patch onto the skin 2 (two) times a week.    [provider]  InFLIXimab (REMICADE IV) Inject into the vein.    [provider]  oxyCODONE-acetaminophen (PERCOCET/ROXICET) 5-325 MG tablet Take 1 tablet by mouth every 6 (six) hours as needed for severe pain. 10/10/18   Tanda Rockers, PA-C  Progesterone Micronized (PROGESTERONE PO) Take by mouth.    [provider]  Allergies    Patient has no known allergies.  Review of Systems   Review of Systems  Constitutional:  Positive for activity change.  Respiratory:  Negative for shortness of breath.   Cardiovascular:  Negative for chest pain.  Gastrointestinal:  Positive for abdominal pain, diarrhea, nausea and vomiting.  Allergic/Immunologic: Positive for immunocompromised state.  All other systems reviewed and are negative.  Physical Exam Updated Vital Signs BP (!) 143/79   Pulse (!) 58   Temp 98.3 F (36.8 C) (Oral)   Resp 15   Ht 5' 6.5" (1.689 m)   Wt 76.7 kg   SpO2 100%   BMI 26.87 kg/m   Physical Exam Vitals and nursing note  reviewed.  Constitutional:      Appearance: She is well-developed.  HENT:     Head: Atraumatic.     Comments: Dry mucosa Cardiovascular:     Rate and Rhythm: Normal rate.  Pulmonary:     Effort: Pulmonary effort is normal.  Abdominal:     General: Abdomen is flat.     Palpations: Abdomen is soft.     Tenderness: There is abdominal tenderness in the right lower quadrant, periumbilical area, suprapubic area and left lower quadrant.  Musculoskeletal:     Cervical back: Normal range of motion and neck supple.  Skin:    General: Skin is warm and dry.  Neurological:     Mental Status: She is alert and oriented to person, place, and time.    ED Results / Procedures / Treatments   Labs (all labs ordered are listed, but only abnormal results are displayed) Labs Reviewed  COMPREHENSIVE METABOLIC PANEL - Abnormal; Notable for the following components:      Result Value   Potassium 3.4 (*)    Glucose, Bld 100 (*)    All other components within normal limits  CBC - Abnormal; Notable for the following components:   WBC 14.3 (*)    All other components within normal limits  URINALYSIS, ROUTINE W REFLEX MICROSCOPIC - Abnormal; Notable for the following components:   Specific Gravity, Urine 1.039 (*)    Hgb urine dipstick SMALL (*)    Ketones, ur >80 (*)    Protein, ur 30 (*)    Leukocytes,Ua SMALL (*)    Bacteria, UA MANY (*)    Non Squamous Epithelial 0-5 (*)    All other components within normal limits  DIFFERENTIAL - Abnormal; Notable for the following components:   Neutro Abs 11.0 (*)    Monocytes Absolute 1.2 (*)    All other components within normal limits  GASTROINTESTINAL PANEL BY PCR, STOOL (REPLACES STOOL CULTURE)  LIPASE, BLOOD  PREGNANCY, URINE  MAGNESIUM    EKG None  Radiology No results found.  Procedures Procedures   Medications Ordered in ED Medications  morphine 4 MG/ML injection 4 mg (4 mg Intravenous Given 01/06/21 1041)  lactated ringers bolus 1,000  mL (1,000 mLs Intravenous New Bag/Given 01/06/21 1047)    ED Course  I have reviewed the triage vital signs and the nursing notes.  Pertinent labs & imaging results that were available during my care of the patient were reviewed by me and considered in my medical decision making (see chart for details).    MDM Rules/Calculators/A&P                            60 year old female comes in with chief complaint of bloody stools, abdominal pain.  Symptoms started after she had farm fresh eggs yesterday.  Given that she is having cramping abdominal pain that gets better with loose BM, that is bloody, suspicion is high for colitis.  No recent antibiotics.  No history of C. difficile.  Timing wise, typically bacterial infection postingestion will have an incubation of few hours maybe even a day or so before symptoms begin -therefore I doubt that the egg ingestion itself causes symptoms.  Patient abdominal exam is not peritoneal.  She denies any history of diverticulosis.  She is not on any blood thinners.   Plan is to start hydration and get basic labs and reassess.  1:01 PM Patient feels better.  Morphine helped her pain.  She continues to have some discomfort nonetheless.  She had 1 bloody BM here.  White count reveals slightly elevated white count.  Hemoglobin is stable.  At length discussion on the next step.  For now we have agreed with the option of starting her on azithromycin for presumed bacterial colitis and have her follow-up with her PCP on Friday.  In the interim, she will hydrate well and return to the ER if she starts having fevers, worsening bleeding, dizziness, lightheadedness, worsening abdominal pain.  The abdominal exam is unchanged.  There is no focal left lower quadrant tenderness and suspicion for diverticulitis is low in the setting of bleeding that is present.  Patient is comfortable with the plan of close PCP follow-up with strict ER return precautions.    Final  Clinical Impression(s) / ED Diagnoses Final diagnoses:  Bloody diarrhea    Rx / DC Orders ED Discharge Orders          Ordered    azithromycin (ZITHROMAX) 250 MG tablet  Daily        01/06/21 1240    ondansetron (ZOFRAN ODT) 8 MG disintegrating tablet  Every 8 hours PRN        01/06/21 1241    loperamide (IMODIUM) 2 MG capsule  4 times daily PRN        01/06/21 1241             Derwood Kaplan, MD 01/06/21 1302    Derwood Kaplan, MD 01/06/21 1303

## 2021-10-18 DIAGNOSIS — R072 Precordial pain: Secondary | ICD-10-CM | POA: Insufficient documentation

## 2021-10-18 NOTE — Progress Notes (Signed)
Cardiology Office Note   Date:  10/19/2021   ID:  Special, Ranes 17-Aug-1960, MRN [id number]  PCP:  Creola Corn, MD  Cardiologist:   Rollene Rotunda, MD Referring:  Creola Corn, MD  Chief Complaint  Patient presents with   Precordial Chest Pain      History of Present Illness: Alice Moore is a 61 y.o. female who is referred by Creola Corn, MD for evaluation of chest pain.  The patient has no diagnosis of coronary artery disease.  However, she has a very strong family history of early coronary artery disease.  She has been getting chest discomfort for about the past year.  This has been exertional when she is walking or perhaps doing yard work.  It is a mid chest discomfort.  It radiates somewhat through to her back.  It is a heaviness.  It can be intense and she has to let it slowly ease off.  She might get short of breath with it.  She is not describing radiation to her jaw or to her arms.  She does say that it has been increasing with frequency and probably in intensity over this past year although she is not getting any at rest.  She is not having associated nausea vomiting or diaphoresis.  She has not had this prior to about a year ago.  She has not had any prior cardiac testing.   Past Medical History:  Diagnosis Date   AS (ankylosing spondylitis) (HCC)     Past Surgical History:  Procedure Laterality Date   DILATION AND CURETTAGE OF UTERUS     TONSILLECTOMY     UTERINE FIBROID SURGERY N/A      Current Outpatient Medications  Medication Sig Dispense Refill   Celecoxib (CELEBREX PO) Take by mouth.     Cetirizine HCl (ZYRTEC PO) Take by mouth.     fluticasone (FLONASE) 50 MCG/ACT nasal spray Place into both nostrils daily.     IMVEXXY MAINTENANCE PACK 4 MCG INST Place 1 capsule vaginally 2 (two) times a week.     InFLIXimab (REMICADE IV) Inject into the vein.     metoprolol tartrate (LOPRESSOR) 25 MG tablet Take one tablet 2 hours prior to the test 1 tablet 0    Multiple Vitamins-Minerals (PRESERVISION AREDS 2) CAPS as directed Orally     tiZANidine (ZANAFLEX) 4 MG tablet Take 4 mg by mouth at bedtime as needed.     Vitamin D, Cholecalciferol, 25 MCG (1000 UT) TABS 1 tablet Orally Once a day     No current facility-administered medications for this visit.    Allergies:   Patient has no known allergies.    Social History:  The patient  reports that she has quit smoking. Her smoking use included cigarettes. She has never used smokeless tobacco. She reports current alcohol use. She reports that she does not use drugs.   Family History:  The patient's family history includes Cancer in her mother and sister; Heart attack (age of onset: 45) in her mother; Heart disease (age of onset: 51) in her father; Heart failure in her father; Stroke in her father.    ROS:  Please see the history of present illness.   Otherwise, review of systems are positive for none.   All other systems are reviewed and negative.    PHYSICAL EXAM: VS:  BP 121/64   Pulse (!) 58   Ht 5\' 7"  (1.702 m)   Wt 179 lb 9.6 oz (81.5  kg)   SpO2 96%   BMI 28.13 kg/m  , BMI Body mass index is 28.13 kg/m. GENERAL:  Well appearing HEENT:  Pupils equal round and reactive, fundi not visualized, oral mucosa unremarkable NECK:  No jugular venous distention, waveform within normal limits, carotid upstroke brisk and symmetric, no bruits, no thyromegaly LYMPHATICS:  No cervical, inguinal adenopathy LUNGS:  Clear to auscultation bilaterally BACK:  No CVA tenderness CHEST:  Unremarkable HEART:  PMI not displaced or sustained,S1 and S2 within normal limits, no S3, no S4, no clicks, no rubs, no murmurs ABD:  Flat, positive bowel sounds normal in frequency in pitch, no bruits, no rebound, no guarding, no midline pulsatile mass, no hepatomegaly, no splenomegaly EXT:  2 plus pulses throughout, no edema, no cyanosis no clubbing SKIN:  No rashes no nodules NEURO:  Cranial nerves II through XII grossly  intact, motor grossly intact throughout PSYCH:  Cognitively intact, oriented to person place and time    EKG:  EKG is ordered today. The ekg ordered today demonstrates sinus rhythm, rate 58, axis within normal limits, intervals within normal limits, no acute ST-T wave changes.   Recent Labs: 01/06/2021: ALT 11; BUN 17; Creatinine, Ser 0.77; Hemoglobin 14.8; Magnesium 1.8; Platelets 374; Potassium 3.4; Sodium 138    Lipid Panel No results found for: "CHOL", "TRIG", "HDL", "CHOLHDL", "VLDL", "LDLCALC", "LDLDIRECT"    Wt Readings from Last 3 Encounters:  10/19/21 179 lb 9.6 oz (81.5 kg)  01/06/21 169 lb (76.7 kg)  11/06/18 180 lb (81.6 kg)      Other studies Reviewed: Additional studies/ records that were reviewed today include: Labs, primary care office records. Review of the above records demonstrates:  Please see elsewhere in the note.     ASSESSMENT AND PLAN:  Precordial chest pain: I am concerned for this being exertional onset angina with increasing frequency and intensity so less stable pattern.  She has dyslipidemia and an extensive family history.  She was a distant cigarette smoker.  Given this the pretest probability of obstructive coronary disease is elevated and at least moderate.  Coronary angiography with CT is indicated.  This will dictate next therapeutic steps as well as goals of therapy for her dyslipidemia.  Dyslipidemia: Her LDL was 130.  HDL 75.  Total 225.  Goals of therapy will be based on the above imaging.   Current medicines are reviewed at length with the patient today.  The patient does not have concerns regarding medicines.  The following changes have been made:  no change  Labs/ tests ordered today include:   Orders Placed This Encounter  Procedures   CT CORONARY MORPH W/CTA COR W/SCORE W/CA W/CM &/OR WO/CM   Basic metabolic panel   EKG 12-Lead     Disposition:   FU with based on the results of the above   Signed, Rollene Rotunda, MD   10/19/2021 11:33 AM    [facility]

## 2021-10-19 ENCOUNTER — Encounter: Payer: Self-pay | Admitting: Cardiology

## 2021-10-19 ENCOUNTER — Ambulatory Visit: Payer: 59 | Admitting: Cardiology

## 2021-10-19 VITALS — BP 121/64 | HR 58 | Ht 67.0 in | Wt 179.6 lb

## 2021-10-19 DIAGNOSIS — R072 Precordial pain: Secondary | ICD-10-CM

## 2021-10-19 LAB — BASIC METABOLIC PANEL
BUN/Creatinine Ratio: 20 (ref 9–23)
BUN: 18 mg/dL (ref 6–24)
CO2: 26 mmol/L (ref 20–29)
Calcium: 9.9 mg/dL (ref 8.7–10.2)
Chloride: 104 mmol/L (ref 96–106)
Creatinine, Ser: 0.88 mg/dL (ref 0.57–1.00)
Glucose: 95 mg/dL (ref 70–99)
Potassium: 4.6 mmol/L (ref 3.5–5.2)
Sodium: 142 mmol/L (ref 134–144)
eGFR: 76 mL/min/{1.73_m2} (ref >59)

## 2021-10-19 MED ORDER — METOPROLOL TARTRATE 25 MG PO TABS: ORAL_TABLET | ORAL | 0 refills | Status: DC

## 2021-10-19 NOTE — Patient Instructions (Signed)
Medication Instructions:  No changes *If you need a refill on your cardiac medications before your next appointment, please call your pharmacy*   Lab Work: Your provider would like for you to have the following labs today: BMET for the CT  If you have labs (blood work) drawn today and your tests are completely normal, you will receive your results only by: MyChart Message (if you have MyChart) OR A paper copy in the mail If you have any lab test that is abnormal or we need to change your treatment, we will call you to review the results.  Follow-Up: At [facility], you and your health needs are our priority.  As part of our continuing mission to provide you with exceptional heart care, we have created designated Provider Care Teams.  These Care Teams include your primary Cardiologist (physician) and Advanced Practice Providers (APPs -  Physician Assistants and Nurse Practitioners) who all work together to provide you with the care you need, when you need it.  We recommend signing up for the patient portal called "MyChart".  Sign up information is provided on this After Visit Summary.  MyChart is used to connect with patients for Virtual Visits (Telemedicine).  Patients are able to view lab/test results, encounter notes, upcoming appointments, etc.  Non-urgent messages can be sent to your provider as well.   To learn more about what you can do with MyChart, go to [email or web address].    Your next appointment:   Pending results   Other Instructions   Your cardiac CT will be scheduled at one of the below locations:   [facility] [address] [place], [place] [address] [phone]  OR  [facility] [address] [place], [place] [address] [phone]  If scheduled at [facility], please arrive at the [facility] (Entrance C2) of [facility] 30 minutes prior to test start  time. You can use the FREE valet parking offered at entrance C (encouraged to control the heart rate for the test)  Proceed to the [facility] Radiology Department (first floor) to check-in and test prep.  All radiology patients and guests should use entrance C2 at [facility], accessed from [facility], even though the hospital's physical address listed is [address]    If scheduled at [facility], please arrive 15 mins early for check-in and test prep.  Please follow these instructions carefully (unless otherwise directed):   On the Night Before the Test: Be sure to Drink plenty of water. Do not consume any caffeinated/decaffeinated beverages or chocolate 12 hours prior to your test. Do not take any antihistamines 12 hours prior to your test.  On the Day of the Test: Drink plenty of water until 1 hour prior to the test. Do not eat any food 4 hours prior to the test. You may take your regular medications prior to the test.  Take metoprolol (Lopressor) two hours prior to test. FEMALES- please wear underwire-free bra if available, avoid dresses & tight clothing        After the Test: Drink plenty of water. After receiving IV contrast, you may experience a mild flushed feeling. This is normal. On occasion, you may experience a mild rash up to 24 hours after the test. This is not dangerous. If this occurs, you can take Benadryl 25 mg and increase your fluid intake. If you experience trouble breathing, this can be serious. If  it is severe call 911 IMMEDIATELY. If it is mild, please call our office. If you take any of these medications: Glipizide/Metformin, Avandament, Glucavance, please do not take 48 hours after completing test unless otherwise instructed.  We will call to schedule your test 2-4 weeks out understanding that some insurance companies will need an authorization prior to the service being performed.   For  non-scheduling related questions, please contact the cardiac imaging nurse navigator should you have any questions/concerns: Rockwell Alexandria, Cardiac Imaging Nurse Navigator Larey Brick, Cardiac Imaging Nurse Navigator [facility] Direct Office Dial: [phone]   For scheduling needs, including cancellations and rescheduling, please call [place], [phone].

## 2021-11-10 ENCOUNTER — Telehealth (HOSPITAL_COMMUNITY): Payer: Self-pay | Admitting: *Deleted

## 2021-11-10 NOTE — Telephone Encounter (Signed)
Attempted to call patient regarding upcoming cardiac CT appointment. °Left message on voicemail with name and callback number ° °Anieya Helman RN Navigator Cardiac Imaging °[facility] °[phone] Office °[phone] Cell ° °

## 2021-11-10 NOTE — Telephone Encounter (Signed)
Patient returning call regarding upcoming cardiac imaging study; pt verbalizes understanding of appt date/time, parking situation and where to check in, pre-test NPO status, and verified current allergies; name and call back number provided for further questions should they arise  Larey Brick RN Navigator Cardiac Imaging [facility] [phone] office [phone] cell  Patient aware to arrive at 1:30pm.

## 2021-11-11 ENCOUNTER — Ambulatory Visit (HOSPITAL_COMMUNITY)
Admission: RE | Admit: 2021-11-11 | Discharge: 2021-11-11 | Disposition: A | Discharge status: Home / Self Care | Payer: 59 | Source: Ambulatory Visit | Attending: Cardiology | Admitting: Cardiology

## 2021-11-11 DIAGNOSIS — R072 Precordial pain: Secondary | ICD-10-CM | POA: Insufficient documentation

## 2021-11-11 MED ORDER — NITROGLYCERIN 0.4 MG SL SUBL
SUBLINGUAL_TABLET | SUBLINGUAL | Status: AC
  Filled 2021-11-11: qty 2

## 2021-11-11 MED ORDER — NITROGLYCERIN 0.4 MG SL SUBL
0.8000 mg | SUBLINGUAL_TABLET | Freq: Once | SUBLINGUAL | Status: AC
  Administered 2021-11-11: 0.8 mg via SUBLINGUAL

## 2021-11-11 MED ORDER — IOHEXOL 350 MG/ML SOLN
100.0000 mL | Freq: Once | INTRAVENOUS | Status: AC | PRN
  Administered 2021-11-11: 100 mL via INTRAVENOUS

## 2021-12-14 ENCOUNTER — Ambulatory Visit: Payer: 59 | Admitting: Cardiology

## 2021-12-27 DIAGNOSIS — E785 Hyperlipidemia, unspecified: Secondary | ICD-10-CM | POA: Insufficient documentation

## 2021-12-27 NOTE — Progress Notes (Unsigned)
Cardiology Office Note   Date:  12/27/2021   ID:  Alice Moore, Alice Moore November 01, 1960, MRN [id number]  PCP:  Creola Corn, MD  Cardiologist:   Rollene Rotunda, MD Referring:  Creola Corn, MD  No chief complaint on file.     History of Present Illness: Alice Moore is a 61 y.o. female who is referred by Creola Corn, MD for evaluation of chest pain.   In July she had mild coronary plaque.  There was 24 - 49% in the mid and distal LAD.  ***      *** The patient has no diagnosis of coronary artery disease.  However, she has a very strong family history of early coronary artery disease.  She has been getting chest discomfort for about the past year.  This has been exertional when she is walking or perhaps doing yard work.  It is a mid chest discomfort.  It radiates somewhat through to her back.  It is a heaviness.  It can be intense and she has to let it slowly ease off.  She might get short of breath with it.  She is not describing radiation to her jaw or to her arms.  She does say that it has been increasing with frequency and probably in intensity over this past year although she is not getting any at rest.  She is not having associated nausea vomiting or diaphoresis.  She has not had this prior to about a year ago.  She has not had any prior cardiac testing.   Past Medical History:  Diagnosis Date   AS (ankylosing spondylitis) (HCC)     Past Surgical History:  Procedure Laterality Date   DILATION AND CURETTAGE OF UTERUS     TONSILLECTOMY     UTERINE FIBROID SURGERY N/A      Current Outpatient Medications  Medication Sig Dispense Refill   Celecoxib (CELEBREX PO) Take by mouth.     Cetirizine HCl (ZYRTEC PO) Take by mouth.     fluticasone (FLONASE) 50 MCG/ACT nasal spray Place into both nostrils daily.     IMVEXXY MAINTENANCE PACK 4 MCG INST Place 1 capsule vaginally 2 (two) times a week.     InFLIXimab (REMICADE IV) Inject into the vein.     metoprolol tartrate (LOPRESSOR) 25  MG tablet Take one tablet 2 hours prior to the test 1 tablet 0   Multiple Vitamins-Minerals (PRESERVISION AREDS 2) CAPS as directed Orally     tiZANidine (ZANAFLEX) 4 MG tablet Take 4 mg by mouth at bedtime as needed.     Vitamin D, Cholecalciferol, 25 MCG (1000 UT) TABS 1 tablet Orally Once a day     No current facility-administered medications for this visit.    Allergies:   Patient has no known allergies.    ROS:  Please see the history of present illness.   Otherwise, review of systems are positive for ***.   All other systems are reviewed and negative.    PHYSICAL EXAM: VS:  There were no vitals taken for this visit. , BMI There is no height or weight on file to calculate BMI. GENERAL:  Well appearing NECK:  No jugular venous distention, waveform within normal limits, carotid upstroke brisk and symmetric, no bruits, no thyromegaly LUNGS:  Clear to auscultation bilaterally CHEST:  Unremarkable HEART:  PMI not displaced or sustained,S1 and S2 within normal limits, no S3, no S4, no clicks, no rubs, *** murmurs ABD:  Flat, positive bowel sounds normal in  frequency in pitch, no bruits, no rebound, no guarding, no midline pulsatile mass, no hepatomegaly, no splenomegaly EXT:  2 plus pulses throughout, no edema, no cyanosis no clubbing    ***GENERAL:  Well appearing HEENT:  Pupils equal round and reactive, fundi not visualized, oral mucosa unremarkable NECK:  No jugular venous distention, waveform within normal limits, carotid upstroke brisk and symmetric, no bruits, no thyromegaly LYMPHATICS:  No cervical, inguinal adenopathy LUNGS:  Clear to auscultation bilaterally BACK:  No CVA tenderness CHEST:  Unremarkable HEART:  PMI not displaced or sustained,S1 and S2 within normal limits, no S3, no S4, no clicks, no rubs, no murmurs ABD:  Flat, positive bowel sounds normal in frequency in pitch, no bruits, no rebound, no guarding, no midline pulsatile mass, no hepatomegaly, no  splenomegaly EXT:  2 plus pulses throughout, no edema, no cyanosis no clubbing SKIN:  No rashes no nodules NEURO:  Cranial nerves II through XII grossly intact, motor grossly intact throughout PSYCH:  Cognitively intact, oriented to person place and time    EKG:  EKG is *** ordered today. The ekg ordered today demonstrates sinus rhythm, rate ***, axis within normal limits, intervals within normal limits, no acute ST-T wave changes.   Recent Labs: 01/06/2021: ALT 11; Hemoglobin 14.8; Magnesium 1.8; Platelets 374 10/19/2021: BUN 18; Creatinine, Ser 0.88; Potassium 4.6; Sodium 142    Lipid Panel No results found for: "CHOL", "TRIG", "HDL", "CHOLHDL", "VLDL", "LDLCALC", "LDLDIRECT"    Wt Readings from Last 3 Encounters:  10/19/21 179 lb 9.6 oz (81.5 kg)  01/06/21 169 lb (76.7 kg)  11/06/18 180 lb (81.6 kg)      Other studies Reviewed: Additional studies/ records that were reviewed today include: *** Review of the above records demonstrates:  Please see elsewhere in the note.     ASSESSMENT AND PLAN:  Precordial chest pain:   She does have mild coronary plaque.   ***  I am concerned for this being exertional onset angina with increasing frequency and intensity so less stable pattern.  She has dyslipidemia and an extensive family history.  She was a distant cigarette smoker.  Given this the pretest probability of obstructive coronary disease is elevated and at least moderate.  Coronary angiography with CT is indicated.  This will dictate next therapeutic steps as well as goals of therapy for her dyslipidemia.  Dyslipidemia: Her LDL was *** 130.  HDL 75.  Total 225.  Goals of therapy will be based on the above imaging.   Current medicines are reviewed at length with the patient today.  The patient does not have concerns regarding medicines.  The following changes have been made:  ***  Labs/ tests ordered today include: ***  No orders of the defined types were placed in this  encounter.    Disposition:   FU ***   Signed, Rollene Rotunda, MD  12/27/2021 2:05 PM    [facility]

## 2021-12-28 ENCOUNTER — Ambulatory Visit: Payer: 59 | Attending: Cardiology | Admitting: Cardiology

## 2021-12-28 ENCOUNTER — Encounter: Payer: Self-pay | Admitting: Cardiology

## 2021-12-28 VITALS — BP 118/68 | HR 68 | Ht 67.0 in | Wt 180.8 lb

## 2021-12-28 DIAGNOSIS — E785 Hyperlipidemia, unspecified: Secondary | ICD-10-CM | POA: Diagnosis not present

## 2021-12-28 DIAGNOSIS — R072 Precordial pain: Secondary | ICD-10-CM

## 2021-12-28 MED ORDER — ROSUVASTATIN CALCIUM 10 MG PO TABS: 10.0000 mg | ORAL_TABLET | Freq: Every day | ORAL | 3 refills | Status: DC

## 2021-12-28 NOTE — Patient Instructions (Signed)
Medication Instructions:  START Crestor 10 mg daily   *If you need a refill on your cardiac medications before your next appointment, please call your pharmacy*   Lab Work: 3 month LIPID (no appointment needed, come fasting- nothing to eat or drink)    If you have labs (blood work) drawn today and your tests are completely normal, you will receive your results only by: MyChart Message (if you have MyChart) OR A paper copy in the mail If you have any lab test that is abnormal or we need to change your treatment, we will call you to review the results.   Follow-Up: At [facility], you and your health needs are our priority.  As part of our continuing mission to provide you with exceptional heart care, we have created designated Provider Care Teams.  These Care Teams include your primary Cardiologist (physician) and Advanced Practice Providers (APPs -  Physician Assistants and Nurse Practitioners) who all work together to provide you with the care you need, when you need it.  We recommend signing up for the patient portal called "MyChart".  Sign up information is provided on this After Visit Summary.  MyChart is used to connect with patients for Virtual Visits (Telemedicine).  Patients are able to view lab/test results, encounter notes, upcoming appointments, etc.  Non-urgent messages can be sent to your provider as well.   To learn more about what you can do with MyChart, go to [email or web address].    Your next appointment:   12 month(s)  The format for your next appointment:   In Person  Provider:   Rollene Rotunda, MD

## 2021-12-29 ENCOUNTER — Encounter: Payer: Self-pay | Admitting: Cardiology

## 2022-01-25 ENCOUNTER — Encounter: Payer: 59 | Attending: Cardiology | Admitting: Dietician

## 2022-01-25 ENCOUNTER — Encounter: Payer: Self-pay | Admitting: Dietician

## 2022-01-25 DIAGNOSIS — E785 Hyperlipidemia, unspecified: Secondary | ICD-10-CM | POA: Insufficient documentation

## 2022-01-25 DIAGNOSIS — Z713 Dietary counseling and surveillance: Secondary | ICD-10-CM | POA: Diagnosis not present

## 2022-01-25 NOTE — Patient Instructions (Addendum)
Aim for 150 minutes of physical activity of weekly.   Add hemp seeds to your yogurt and fruit.   Eat a balanced diet with whole grains, fruits and vegetables, and lean protein sources.  Choose heart-healthy unsaturated fats. Limit saturated fats, trans fats, and cholesterol intake. Eat more plant-based or vegetarian meals using beans and soy foods for protein.  Eat whole, unprocessed foods to limit the amount of sodium (salt) you eat.  Limit refined carbohydrates especially sugar, sweets and sugar-sweetened beverages.  Dressing recommendation: [employer] (in refrigerated produce section)

## 2022-01-25 NOTE — Progress Notes (Signed)
Medical Nutrition Therapy  Appointment Start time:  [phone]  Appointment End time:  0922  Primary concerns today: Pt just started taking a cholesterol medication but doesn't want to take it permanently. Parent hx of heart attack, high cholesterol, and stroke. Pt interested in some weight loss and also wants to learn about plant-based protein sources.   Referral diagnosis: dyslipidemia Preferred learning style: no preference indicated Learning readiness: ready   NUTRITION ASSESSMENT   Anthropometrics  Ht: 67in Wt: not assessed  Clinical Medical Hx: arthritis Medications: reviewed Labs: reviewed Notable Signs/Symptoms: none  Lifestyle & Dietary Hx Pt lives with husband. Pt states husband is a big 'meat and potatoes' person and likes beef but pt does not like beef much. Pt states she is an animal lover and isn't a big fan of animal products but is more inclined to eat [place] or chicken.   Pt wants to learn about plant-based protein sources and how to make a balanced plant-based meal.   Pt cooks 5 nights a week. Pt states she makes a lot of vegetables and has salads with dinner multiple times per week. Pt is not a fan of beans but has recently started branching out and trying more foods. Pt states she only likes flakier white fish.  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   Estimated daily fluid intake: 32-64 oz Supplements: MVI, vitamin D, preservision Sleep: 5-6 hours, wakes up frequently Stress / self-care: mild stress Current average weekly physical activity: walks 3-4 times per week at 5:30am for 2 miles.   24-Hr Dietary Recall First Meal: greek yogurt and fruit Snack: none Second Meal: leftovers- chicken thighs, salad, corn, rice Snack: raw veggies OR fruit and cheese Third Meal: vegetable beef soup Snack: fresh popped popcorn in soybean oil with salt Beverages: coffee with stevia and half and half, water, unsweet tea, occasional wine or  margarita   NUTRITION DIAGNOSIS  NB-1.1 Food and nutrition-related knowledge deficit As related to dyslipidemia.  As evidenced by pt report and diet hx.   NUTRITION INTERVENTION  Nutrition education (E-1) on the following topics:  MyPlate Plant-based meals and proteins Importance of physical activity Functions of fiber Saturated vs unsaturated fat Refined vs whole grains Limiting added sugars Sources of omega 3's  Handouts Provided Include  Dish Up a Healthy Meal Nutrition Care Manual: Heart Healthy Nutrition Therapy  Learning Style & Readiness for Change Teaching method utilized: Visual & Auditory  Demonstrated degree of understanding via: Teach Back  Barriers to learning/adherence to lifestyle change: none  Goals Established by Pt Aim for 150 minutes of physical activity of weekly.   Add hemp seeds to your yogurt and fruit.   Eat a balanced diet with whole grains, fruits and vegetables, and lean protein sources.  Choose heart-healthy unsaturated fats. Limit saturated fats, trans fats, and cholesterol intake. Eat more plant-based or vegetarian meals using beans and soy foods for protein.  Eat whole, unprocessed foods to limit the amount of sodium (salt) you eat.  Limit refined carbohydrates especially sugar, sweets and sugar-sweetened beverages.  Dressing recommendation: [employer] (in refrigerated produce section)   MONITORING & EVALUATION Dietary intake, weekly physical activity, and follow up as needed.  Next Steps  Patient is to call for questions.

## 2022-03-29 ENCOUNTER — Emergency Department (HOSPITAL_BASED_OUTPATIENT_CLINIC_OR_DEPARTMENT_OTHER)
Admission: EM | Admit: 2022-03-29 | Discharge: 2022-03-29 | Disposition: A | Discharge status: Home / Self Care | Payer: 59 | Attending: Emergency Medicine | Admitting: Emergency Medicine

## 2022-03-29 ENCOUNTER — Other Ambulatory Visit: Payer: Self-pay

## 2022-03-29 ENCOUNTER — Encounter (HOSPITAL_BASED_OUTPATIENT_CLINIC_OR_DEPARTMENT_OTHER): Payer: Self-pay | Admitting: Pediatrics

## 2022-03-29 DIAGNOSIS — Z87891 Personal history of nicotine dependence: Secondary | ICD-10-CM | POA: Insufficient documentation

## 2022-03-29 DIAGNOSIS — L02211 Cutaneous abscess of abdominal wall: Secondary | ICD-10-CM | POA: Diagnosis not present

## 2022-03-29 DIAGNOSIS — L0291 Cutaneous abscess, unspecified: Secondary | ICD-10-CM

## 2022-03-29 LAB — COMPREHENSIVE METABOLIC PANEL
ALT: 15 U/L (ref 0–44)
AST: 26 U/L (ref 15–41)
Albumin: 4.7 g/dL (ref 3.5–5.0)
Alkaline Phosphatase: 93 U/L (ref 38–126)
Anion gap: 11 (ref 5–15)
BUN: 19 mg/dL (ref 6–20)
CO2: 23 mmol/L (ref 22–32)
Calcium: 9.5 mg/dL (ref 8.9–10.3)
Chloride: 103 mmol/L (ref 98–111)
Creatinine, Ser: 0.81 mg/dL (ref 0.44–1.00)
GFR, Estimated: >60 mL/min (ref >60)
Glucose, Bld: 99 mg/dL (ref 70–99)
Potassium: 3.7 mmol/L (ref 3.5–5.1)
Sodium: 137 mmol/L (ref 135–145)
Total Bilirubin: 0.6 mg/dL (ref 0.3–1.2)
Total Protein: 8.5 g/dL — ABNORMAL HIGH (ref 6.5–8.1)

## 2022-03-29 LAB — CBC WITH DIFFERENTIAL/PLATELET
Abs Immature Granulocytes: 0.04 10*3/uL (ref 0.00–0.07)
Basophils Absolute: 0.1 10*3/uL (ref 0.0–0.1)
Basophils Relative: 1 %
Eosinophils Absolute: 0.2 10*3/uL (ref 0.0–0.5)
Eosinophils Relative: 2 %
HCT: 39.7 % (ref 36.0–46.0)
Hemoglobin: 13 g/dL (ref 12.0–15.0)
Immature Granulocytes: 0 %
Lymphocytes Relative: 33 %
Lymphs Abs: 3.2 10*3/uL (ref 0.7–4.0)
MCH: 29.8 pg (ref 26.0–34.0)
MCHC: 32.7 g/dL (ref 30.0–36.0)
MCV: 91.1 fL (ref 80.0–100.0)
Monocytes Absolute: 0.9 10*3/uL (ref 0.1–1.0)
Monocytes Relative: 9 %
Neutro Abs: 5.3 10*3/uL (ref 1.7–7.7)
Neutrophils Relative %: 55 %
Platelets: 316 10*3/uL (ref 150–400)
RBC: 4.36 MIL/uL (ref 3.87–5.11)
RDW: 12.2 % (ref 11.5–15.5)
WBC: 9.7 10*3/uL (ref 4.0–10.5)
nRBC: 0 % (ref 0.0–0.2)

## 2022-03-29 MED ORDER — LIDOCAINE HCL (PF) 1 % IJ SOLN
10.0000 mL | Freq: Once | INTRAMUSCULAR | Status: AC
  Administered 2022-03-29: 10 mL
  Filled 2022-03-29: qty 10

## 2022-03-29 MED ORDER — DOXYCYCLINE HYCLATE 100 MG PO CAPS: 100.0000 mg | ORAL_CAPSULE | Freq: Two times a day (BID) | ORAL | 0 refills | Status: AC

## 2022-03-29 NOTE — Discharge Instructions (Signed)
We evaluated you for your abdominal wound.  We opened the wound slightly but were not able to get any additional pus drainage.  Please continue with your antibiotics.  We have marked the area in your skin where you have an infection.  If this gets worse, or if you develop any fevers or chills, severe abdominal pain, nausea or vomiting, please return to the emergency department for repeat evaluation.

## 2022-03-29 NOTE — ED Triage Notes (Signed)
C/O ingrown hair on lower abdominal area and she attempted to remove it but it appears to worsen. Ongoing for over a week, states she was prescribed doxycycline for 5 days by PCP but wound site still did not improved. Went to UC and Cephalexin oral along with topical mupirocin ointment was added yesterday. C/O ongoing pain and some drainage, initially blood tinged and eventually became more bloody.

## 2022-03-29 NOTE — ED Provider Notes (Signed)
[facility] EMERGENCY DEPARTMENT Provider Note  CSN: [id number] Arrival date & time: 03/29/22 1252  Chief Complaint(s) Wound Infection  HPI Alice Moore is a 61 y.o. female with history of ankylosing spondylitis presenting to the emergency department with abdominal wounds.  Patient reports that she developed an abdominal wound on her lower abdomen last week, called her primary doctor who prescribed doxycycline.  She reports that she plucked an ingrown hair from the wound.  She reports that today she had an episode where she tried to squeeze it and it drained some pus and blood.  She reports some ongoing pain.  She was also started on Keflex yesterday at an urgent care.  She presented today because she was worried that there was an abscess.  She denies any fevers or chills.  She denies any abdominal pain other than at the site of the infection, nausea, vomiting.   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 Authorizing Provider  albuterol (VENTOLIN HFA) 108 (90 Base) MCG/ACT inhaler as needed. 07/27/16   [provider]  celecoxib (CELEBREX) 200 MG capsule Take 200 mg by mouth daily. 11/08/21   [provider]  cetirizine (ZYRTEC ALLERGY) 10 MG tablet 1 tablet Orally Once a day    [provider]  doxycycline (VIBRAMYCIN) 100 MG capsule Take 1 capsule (100 mg total) by mouth 2 (two) times daily for 7 days. 03/29/22 04/05/22  Lonell Grandchild, MD  fluticasone (FLONASE) 50 MCG/ACT nasal spray Place into both nostrils daily.    [provider]  IMVEXXY MAINTENANCE PACK 4 MCG INST Place 1  capsule vaginally 2 (two) times a week. 10/15/21   [provider]  inFLIXimab (REMICADE) 100 MG injection Every 6 weeks.    [provider]  Multiple Vitamins-Minerals (PRESERVISION AREDS 2) CAPS as directed Orally    [provider]  rosuvastatin (CRESTOR) 10 MG tablet Take 1 tablet (10 mg total) by mouth daily. 12/28/21 12/23/22  Rollene Rotunda, MD  tiZANidine (ZANAFLEX) 4 MG tablet Take 4 mg by mouth at bedtime as needed. 10/11/21   [provider]  Vitamin D, Cholecalciferol, 25 MCG (1000 UT) TABS 1 tablet Orally Once a day    [provider]                                                                                                                                    Past Surgical History Past Surgical History:  Procedure Laterality Date   DILATION AND CURETTAGE OF UTERUS     TONSILLECTOMY  UTERINE FIBROID SURGERY N/A    Family History Family History  Problem Relation Age of Onset   Cancer Mother    Heart attack Mother 4053   Stroke Father    Heart failure Father    Heart disease Father [id number]       CABG late 6350s   Cancer Sister     Social History Social History   Tobacco Use   Smoking status: Former    Types: Cigarettes   Smokeless tobacco: Never   Tobacco comments:    Quit 25 years ago  Vaping Use   Vaping Use: Never used  Substance Use Topics   Alcohol use: Yes    Comment: occ   Drug use: No   Allergies Patient has no known allergies.  Review of Systems Review of Systems  All other systems reviewed and are negative.   Physical Exam Vital Signs  I have reviewed the triage vital signs BP (!) 135/95   Pulse 62   Temp (!) 97.4 F (36.3 C) (Oral)   Resp 18   Ht 5\' 7"  (1.702 m)   Wt 81.6 kg   SpO2 100%   BMI 28.19 kg/m  Physical Exam Vitals and nursing note reviewed.  Constitutional:      General: She is not in acute distress.    Appearance: She is well-developed.  HENT:     Head: Normocephalic and  atraumatic.     Mouth/Throat:     Mouth: Mucous membranes are moist.  Eyes:     Pupils: Pupils are equal, round, and reactive to light.  Cardiovascular:     Rate and Rhythm: Normal rate and regular rhythm.     Heart sounds: No murmur heard. Pulmonary:     Effort: Pulmonary effort is normal. No respiratory distress.     Breath sounds: Normal breath sounds.  Abdominal:     General: Abdomen is flat.     Palpations: Abdomen is soft.     Tenderness: There is no abdominal tenderness.     Comments: On the lower abdomen there is a approximately 5 x 5 cm circular area of erythema, at the center of this there is a small wound with some serosanguineous drainage and induration  Musculoskeletal:        General: No tenderness.     Right lower leg: No edema.     Left lower leg: No edema.  Skin:    General: Skin is warm and dry.  Neurological:     General: No focal deficit present.     Mental Status: She is alert. Mental status is at baseline.  Psychiatric:        Mood and Affect: Mood normal.        Behavior: Behavior normal.     ED Results and Treatments Labs (all labs ordered are listed, but only abnormal results are displayed) Labs Reviewed  COMPREHENSIVE METABOLIC PANEL - Abnormal; Notable for the following components:      Result Value   Total Protein 8.5 (*)    All other components within normal limits  CBC WITH DIFFERENTIAL/PLATELET  Radiology No results found.  Pertinent labs & imaging results that were available during my care of the patient were reviewed by me and considered in my medical decision making (see MDM for details).  Medications Ordered in ED Medications  lidocaine (PF) (XYLOCAINE) 1 % injection 10 mL (has no administration in time range)                                                                                                                                      Procedures Ultrasound ED Soft Tissue  Date/Time: 03/29/2022 6:01 PM  Performed by: Lonell Grandchild, MD Authorized by: Lonell Grandchild, MD   Procedure details:    Indications: localization of abscess     Transverse view:  Visualized   Longitudinal view:  Visualized   Images: archived   Location:    Location: abdominal wall     Side:  Midline Findings:     abscess present    cellulitis present    no foreign body present .Marland KitchenIncision and Drainage  Date/Time: 03/29/2022 6:01 PM  Performed by: Lonell Grandchild, MD Authorized by: Lonell Grandchild, MD   Consent:    Consent obtained:  Verbal   Consent given by:  Patient   Risks, benefits, and alternatives were discussed: yes     Risks discussed:  Bleeding, damage to other organs, incomplete drainage, infection and pain   Alternatives discussed:  No treatment Universal protocol:    Patient identity confirmed:  Verbally with patient and arm band Location:    Type:  Abscess   Location:  Trunk   Trunk location:  Abdomen Pre-procedure details:    Skin preparation:  Povidone-iodine Sedation:    Sedation type:  None Anesthesia:    Anesthesia method:  Local infiltration   Local anesthetic:  Lidocaine 1% WITH epi Procedure type:    Complexity:  Complex Procedure details:    Ultrasound guidance: yes     Needle aspiration: no     Incision types:  Single straight   Incision depth:  Dermal   Wound management:  Probed and deloculated   Drainage:  Serosanguinous and bloody   Drainage amount:  Scant   Wound treatment:  Wound left open   Packing materials:  None Post-procedure details:    Procedure completion:  Tolerated well, no immediate complications   (including critical care time)  Medical Decision Making / ED Course   MDM:  61 year old female presenting to the emergency department with wound.  On exam, patient had area of cellulitis and induration.  This area was in her lower  abdomen.  She had no abdominal tenderness to suggest any deep space abdominal infection.  Suspect this occurred from ingrown hair.  She had no nausea or vomiting to suggest this either.  Bedside ultrasound performed with demonstrated a small hypoechoic area concerning for abscess.  Small superficial incision made to extend the existing wound.  Wound probed with sterile cotton swab, minimal additional drainage expressed.  Suspect most of purulence drained when patient squeezed it earlier and wound opened spontaneously earlier today.  Will continue with doxycycline and Keflex.  Will demarcate area of cellulitis and discussed with patient need to return should it worsen.      Additional history obtained: -Additional history obtained from spouse -External records from outside source obtained and reviewed including: Chart review including previous notes, labs, imaging, consultation notes including PMD call 03/26/22   Lab Tests: -I ordered, reviewed, and interpreted labs.   The pertinent results include:   Labs Reviewed  COMPREHENSIVE METABOLIC PANEL - Abnormal; Notable for the following components:      Result Value   Total Protein 8.5 (*)    All other components within normal limits  CBC WITH DIFFERENTIAL/PLATELET    Notable for no leukocytosis  Medicines ordered and prescription drug management: Meds ordered this encounter  Medications   lidocaine (PF) (XYLOCAINE) 1 % injection 10 mL   doxycycline (VIBRAMYCIN) 100 MG capsule    Sig: Take 1 capsule (100 mg total) by mouth 2 (two) times daily for 7 days.    Dispense:  14 capsule    Refill:  0    -I have reviewed the patients home medicines and have made adjustments as needed    Reevaluation: After the interventions noted above, I reevaluated the patient and found that they have improved  Co morbidities that complicate the patient evaluation  Past Medical History:  Diagnosis Date   AS (ankylosing spondylitis) (HCC)        Dispostion: Disposition decision including need for hospitalization was considered, and patient discharged from emergency department.    Final Clinical Impression(s) / ED Diagnoses Final diagnoses:  Abscess     This chart was dictated using voice recognition software.  Despite best efforts to proofread,  errors can occur which can change the documentation meaning.    Lonell Grandchild, MD 03/29/22 [phone]

## 2022-04-21 ENCOUNTER — Telehealth: Payer: Self-pay | Admitting: Cardiology

## 2022-04-21 NOTE — Telephone Encounter (Signed)
Pt states that for the last couple of weeks she's been feeling dizzy, lightheadedness and a funny feeling in her chest. Pt would like a callback regarding this matter. Please advise

## 2022-04-21 NOTE — Telephone Encounter (Signed)
Spoke to patient she stated she has been feeling dizzy and light headed for several months. No chest pain.Stated seems worse in the past 2 weeks.Apple watch shows irregular heart beat, bigeminy.Appointment scheduled with Diona Browner NP 04/29/22 at 8:50 am.I will make Dr.Hochrein aware.

## 2022-04-29 ENCOUNTER — Encounter: Payer: Self-pay | Admitting: Nurse Practitioner

## 2022-04-29 ENCOUNTER — Ambulatory Visit: Payer: 59 | Attending: Nurse Practitioner

## 2022-04-29 ENCOUNTER — Ambulatory Visit: Payer: 59 | Attending: Nurse Practitioner | Admitting: Nurse Practitioner

## 2022-04-29 VITALS — BP 160/82 | HR 65 | Ht 67.0 in | Wt 184.4 lb

## 2022-04-29 DIAGNOSIS — I493 Ventricular premature depolarization: Secondary | ICD-10-CM | POA: Diagnosis not present

## 2022-04-29 DIAGNOSIS — R42 Dizziness and giddiness: Secondary | ICD-10-CM

## 2022-04-29 DIAGNOSIS — R002 Palpitations: Secondary | ICD-10-CM

## 2022-04-29 DIAGNOSIS — R030 Elevated blood-pressure reading, without diagnosis of hypertension: Secondary | ICD-10-CM

## 2022-04-29 DIAGNOSIS — R001 Bradycardia, unspecified: Secondary | ICD-10-CM | POA: Diagnosis not present

## 2022-04-29 DIAGNOSIS — I2510 Atherosclerotic heart disease of native coronary artery without angina pectoris: Secondary | ICD-10-CM | POA: Diagnosis not present

## 2022-04-29 DIAGNOSIS — R072 Precordial pain: Secondary | ICD-10-CM | POA: Diagnosis not present

## 2022-04-29 DIAGNOSIS — E785 Hyperlipidemia, unspecified: Secondary | ICD-10-CM

## 2022-04-29 MED ORDER — METOPROLOL TARTRATE 25 MG PO TABS: 12.5000 mg | ORAL_TABLET | Freq: Two times a day (BID) | ORAL | 3 refills | Status: DC

## 2022-04-29 NOTE — Progress Notes (Unsigned)
Enrolled for [employer] to mail a ZIO XT long term holter monitor to the patients address on file.   Dr. Hochrein to read. 

## 2022-04-29 NOTE — Patient Instructions (Addendum)
Medication Instructions:  Start Metoprolol Tartrate 12.5 mg twice daily  *If you need a refill on your cardiac medications before your next appointment, please call your pharmacy*   Lab Work: NONE ordered at this time of appointment   If you have labs (blood work) drawn today and your tests are completely normal, you will receive your results only by: [place] (if you have MyChart) OR A paper copy in the mail If you have any lab test that is abnormal or we need to change your treatment, we will call you to review the results.   Testing/Procedures: [place] [place]- Long Term Monitor Instructions  Your physician has requested you wear a ZIO patch monitor for 14 days.  This is a single patch monitor. [employer] supplies one patch monitor per enrollment. Additional stickers are not available. Please do not apply patch if you will be having a Nuclear Stress Test,  Echocardiogram, Cardiac CT, MRI, or Chest Xray during the period you would be wearing the  monitor. The patch cannot be worn during these tests. You cannot remove and re-apply the  ZIO XT patch monitor.  Your ZIO patch monitor will be mailed 3 day USPS to your address on file. It may take 3-5 days  to receive your monitor after you have been enrolled.  Once you have received your monitor, please review the enclosed instructions. Your monitor  has already been registered assigning a specific monitor serial # to you.  Billing and Patient Assistance Program Information  We have supplied [employer] with any of your insurance information on file for billing purposes. [employer] offers a sliding scale Patient Assistance Program for patients that do not have  insurance, or whose insurance does not completely cover the cost of the ZIO monitor.  You must apply for the Patient Assistance Program to qualify for this discounted rate.  To apply, please call [employer] at [phone], select option 4, select option 2, ask to apply for  Patient  Assistance Program. Theodore Demark will ask your household income, and how many people  are in your household. They will quote your out-of-pocket cost based on that information.  [employer] will also be able to set up a 28-month, interest-free payment plan if needed.  Applying the monitor   Shave hair from upper left chest.  Hold abrader disc by orange tab. Rub abrader in 40 strokes over the upper left chest as  indicated in your monitor instructions.  Clean area with 4 enclosed alcohol pads. Let dry.  Apply patch as indicated in monitor instructions. Patch will be placed under collarbone on left  side of chest with arrow pointing upward.  Rub patch adhesive wings for 2 minutes. Remove white label marked "1". Remove the white  label marked "2". Rub patch adhesive wings for 2 additional minutes.  While looking in a mirror, press and release button in center of patch. A small green light will  flash 3-4 times. This will be your only indicator that the monitor has been turned on.  Do not shower for the first 24 hours. You may shower after the first 24 hours.  Press the button if you feel a symptom. You will hear a small click. Record Date, Time and  Symptom in the Patient Logbook.  When you are ready to remove the patch, follow instructions on the last 2 pages of Patient  Logbook. Stick patch monitor onto the last page of Patient Logbook.  Place Patient Logbook in the blue and white box. Use locking tab  on box and tape box closed  securely. The blue and white box has prepaid postage on it. Please place it in the mailbox as  soon as possible. Your physician should have your test results approximately 7 days after the  monitor has been mailed back to [facility].  Call [facility] at [phone] if you have questions regarding  your ZIO XT patch monitor. Call them immediately if you see an orange light blinking on your  monitor.  If your monitor falls off in less than 4 days,  contact our Monitor department at [phone].  If your monitor becomes loose or falls off after 4 days call [employer] at [phone] for  suggestions on securing your monitor    Follow-Up: At [facility], you and your health needs are our priority.  As part of our continuing mission to provide you with exceptional heart care, we have created designated Provider Care Teams.  These Care Teams include your primary Cardiologist (physician) and Advanced Practice Providers (APPs -  Physician Assistants and Nurse Practitioners) who all work together to provide you with the care you need, when you need it.  We recommend signing up for the patient portal called "MyChart".  Sign up information is provided on this After Visit Summary.  MyChart is used to connect with patients for Virtual Visits (Telemedicine).  Patients are able to view lab/test results, encounter notes, upcoming appointments, etc.  Non-urgent messages can be sent to your provider as well.   To learn more about what you can do with MyChart, go to [email or web address].    Your next appointment:   4-6 weeks 1st Available ([place]) Provider:   Diona Browner, NP        Other Instructions Referral placed for EP doctor. Come back for lab work when your fasting.

## 2022-04-29 NOTE — Progress Notes (Signed)
Office Visit    Patient Name: Katori Wirsing Date of Encounter: 04/29/2022  Primary Care Provider:  Shon Baton, MD Primary Cardiologist:  Minus Breeding, MD  Chief Complaint    62 year old female with a history of mild nonobstructive CAD, precordial chest pain, dizziness, hyperlipidemia, and ankylosing spondylitis who presents for follow-up related to dizziness.  Past Medical History    Past Medical History:  Diagnosis Date   AS (ankylosing spondylitis) (HCC)    Past Surgical History:  Procedure Laterality Date   DILATION AND CURETTAGE OF UTERUS     TONSILLECTOMY     UTERINE FIBROID SURGERY N/A     Allergies  No Known Allergies   Labs/Other Studies Reviewed    The following studies were reviewed today: Coronary CTA 11/11/2021: IMPRESSION: 1. Coronary calcium score of 58.9. This was 43 percentile for age and sex matched control.   2. Normal coronary origin with right dominance.   3. CAD-RADS 2. Mild non-obstructive CAD (25-49%). Consider non-atherosclerotic causes of chest pain. Consider preventive therapy and risk factor modification.   The noncardiac portion of this study will be interpreted in separate report by the radiologist.  Recent Labs: 03/29/2022: ALT 15; BUN 19; Creatinine, Ser 0.81; Hemoglobin 13.0; Platelets 316; Potassium 3.7; Sodium 137  Recent Lipid Panel No results found for: "CHOL", "TRIG", "HDL", "CHOLHDL", "VLDL", "LDLCALC", "LDLDIRECT"  History of Present Illness    62 year old female with the above past medical history including mild nonobstructive CAD, precordial chest pain, hyperlipidemia, dizziness, and ankylosing spondylitis.  She was referred to cardiology by her PCP for the evaluation of chest pain.  She has a strong family history of early CAD.  Coronary CTA in 10/2021 revealed coronary calcium score of 58.9 (88th percentile), mild nonobstructive CAD (25 to 49% in the proximal LAD).  She was last seen in the office on 12/28/2021  and was stable from a cardiac standpoint. She denied worsening chest pain.  She was started on Crestor and was referred to a nutritionist.  She contacted our office on 04/21/2022 noting a several week history of dizziness, and a "funny feeling in her chest."  She stated that her Apple Watch showed "irregular heartbeat, bigeminy."  She presents today for follow-up.  She notes she has a longstanding history of palpitations with PVCs over the years. Over the past 2-3 weeks she has noticed very frequent symptomatic PVCs.  Her symptoms include a fluttering in her chest, with occasional dizziness and shortness of breath, she also notices a "heavy" sensation in her neck.  She denies chest pain, presyncope, syncope.  She drinks 2 cups of coffee a day otherwise, she limits her caffeine intake, she drinks wine in moderation.  She denies any recent illness, stress, or anxiety.  Home Medications    Current Outpatient Medications  Medication Sig Dispense Refill   albuterol (VENTOLIN HFA) 108 (90 Base) MCG/ACT inhaler as needed.     celecoxib (CELEBREX) 200 MG capsule Take 200 mg by mouth daily.     cetirizine (ZYRTEC ALLERGY) 10 MG tablet 1 tablet Orally Once a day     fluticasone (FLONASE) 50 MCG/ACT nasal spray Place into both nostrils daily.     IMVEXXY MAINTENANCE PACK 4 MCG INST Place 1 capsule vaginally 2 (two) times a week.     inFLIXimab (REMICADE) 100 MG injection Every 6 weeks.     Multiple Vitamins-Minerals (PRESERVISION AREDS 2) CAPS as directed Orally     rosuvastatin (CRESTOR) 10 MG tablet Take 1 tablet (10 mg  total) by mouth daily. 90 tablet 3   tiZANidine (ZANAFLEX) 4 MG tablet Take 4 mg by mouth at bedtime as needed.     Vitamin D, Cholecalciferol, 25 MCG (1000 UT) TABS 1 tablet Orally Once a day     metoprolol tartrate (LOPRESSOR) 25 MG tablet Take 0.5 tablets (12.5 mg total) by mouth 2 (two) times daily. 90 tablet 3   No current facility-administered medications for this visit.     Review  of Systems    She denies chest pain, pnd, orthopnea, n, v, syncope, edema, weight gain, or early satiety. All other systems reviewed and are otherwise negative except as noted above.   Physical Exam    VS:  BP (!) 160/82   Pulse 65   Ht 5\' 7"  (1.702 m)   Wt 184 lb 6.4 oz (83.6 kg)   SpO2 99%   BMI 28.88 kg/m   GEN: Well nourished, well developed, in no acute distress. HEENT: normal. Neck: Supple, no JVD, carotid bruits, or masses. Cardiac: RRR frequent ectopy, no murmurs, rubs, or gallops. No clubbing, cyanosis, edema.  Radials/DP/PT 2+ and equal bilaterally.  Respiratory:  Respirations regular and unlabored, clear to auscultation bilaterally. GI: Soft, nontender, nondistended, BS + x 4. MS: no deformity or atrophy. Skin: warm and dry, no rash. Neuro:  Strength and sensation are intact. Psych: Normal affect.  Accessory Clinical Findings    ECG personally reviewed by me today -Sinus rhythm, 65 bpm, frequent PVCs- no acute changes.   Lab Results  Component Value Date   WBC 9.7 03/29/2022   HGB 13.0 03/29/2022   HCT 39.7 03/29/2022   MCV 91.1 03/29/2022   PLT 316 03/29/2022   Lab Results  Component Value Date   CREATININE 0.81 03/29/2022   BUN 19 03/29/2022   NA 137 03/29/2022   K 3.7 03/29/2022   CL 103 03/29/2022   CO2 23 03/29/2022   Lab Results  Component Value Date   ALT 15 03/29/2022   AST 26 03/29/2022   ALKPHOS 93 03/29/2022   BILITOT 0.6 03/29/2022   No results found for: "CHOL", "HDL", "LDLCALC", "LDLDIRECT", "TRIG", "CHOLHDL"  No results found for: "HGBA1C"  Assessment & Plan    1. Dizziness/palpitations/frequent PVCs/bradycardia: She has a longstanding history of palpitations/PVCs.  However over the past 2 to 3 weeks she has noticed very frequent symptomatic PVCs.  Her Apple Watch has given her notifications of frequent bigeminy.  She has also had some notifications of bradycardia with HR in the 40s.  Will check 14-day zio.  Discussed with Dr.  Percival Spanish, will go ahead and refer to EP as I anticipate high PVC burden. Will start metoprolol 12.5 mg bid. Discussed ED precautions, potential triggers.   2. Nonobstructive CAD/precordial pain: Coronary CTA in 10/2021 revealed coronary calcium score of 58.9 (88th percentile), mild nonobstructive CAD (25 to 49% in the proximal LAD). Stable with no anginal symptoms. No indication for ischemic evaluation.  Continue Crestor.  3. Elevated BP reading: Be elevated in office today.  She has not been checking her BP at home.  She is not on any antihypertensive medication at this point in time.  Will start metoprolol as above. Continue to monitor and report BP consistently > 130/80.  Suspect she may need additional antihypertensive medication in the future.  4. Hyperlipidemia: LDL was 130 in 08/2021.  She overdue for repeat lipids.  She will come back for fasting lipid panel.  Continue Crestor.    5. Disposition: Follow-up in 4-6  weeks with APP, follow up with EP.   HYPERTENSION CONTROL Vitals:   04/29/22 0925 04/29/22 1009  BP: (!) 172/98 (!) 160/82    The patient's blood pressure is elevated above target today.  In order to address the patient's elevated BP: A new medication was prescribed today.; Blood pressure will be monitored at home to determine if medication changes need to be made.; Follow up with general cardiology has been recommended.     Joylene Grapes, NP 04/29/2022, 1:31 PM

## 2022-04-30 ENCOUNTER — Other Ambulatory Visit: Payer: Self-pay

## 2022-04-30 DIAGNOSIS — E785 Hyperlipidemia, unspecified: Secondary | ICD-10-CM

## 2022-04-30 LAB — LIPID PANEL
Chol/HDL Ratio: 1.6 ratio (ref 0.0–4.4)
Cholesterol, Total: 161 mg/dL (ref 100–199)
HDL: 101 mg/dL (ref >39)
LDL Chol Calc (NIH): 51 mg/dL (ref 0–99)
Triglycerides: 38 mg/dL (ref 0–149)
VLDL Cholesterol Cal: 9 mg/dL (ref 5–40)

## 2022-05-04 DIAGNOSIS — I493 Ventricular premature depolarization: Secondary | ICD-10-CM

## 2022-05-04 DIAGNOSIS — R42 Dizziness and giddiness: Secondary | ICD-10-CM

## 2022-05-04 DIAGNOSIS — R002 Palpitations: Secondary | ICD-10-CM

## 2022-05-31 ENCOUNTER — Telehealth: Payer: Self-pay

## 2022-05-31 NOTE — Telephone Encounter (Signed)
Spoke with pt. Pt was notified of monitor results. Pt will continue medication and f/u as planned.

## 2022-06-04 ENCOUNTER — Ambulatory Visit: Payer: 59 | Attending: Nurse Practitioner | Admitting: Nurse Practitioner

## 2022-06-04 ENCOUNTER — Ambulatory Visit (HOSPITAL_COMMUNITY): Payer: 59 | Attending: Nurse Practitioner

## 2022-06-04 ENCOUNTER — Encounter: Payer: Self-pay | Admitting: Nurse Practitioner

## 2022-06-04 VITALS — BP 126/82 | HR 46 | Ht 67.0 in | Wt 185.0 lb

## 2022-06-04 DIAGNOSIS — R42 Dizziness and giddiness: Secondary | ICD-10-CM | POA: Diagnosis not present

## 2022-06-04 DIAGNOSIS — I2510 Atherosclerotic heart disease of native coronary artery without angina pectoris: Secondary | ICD-10-CM

## 2022-06-04 DIAGNOSIS — I493 Ventricular premature depolarization: Secondary | ICD-10-CM

## 2022-06-04 DIAGNOSIS — E785 Hyperlipidemia, unspecified: Secondary | ICD-10-CM

## 2022-06-04 DIAGNOSIS — R072 Precordial pain: Secondary | ICD-10-CM

## 2022-06-04 DIAGNOSIS — R030 Elevated blood-pressure reading, without diagnosis of hypertension: Secondary | ICD-10-CM

## 2022-06-04 DIAGNOSIS — R002 Palpitations: Secondary | ICD-10-CM

## 2022-06-04 DIAGNOSIS — I340 Nonrheumatic mitral (valve) insufficiency: Secondary | ICD-10-CM

## 2022-06-04 LAB — ECHOCARDIOGRAM COMPLETE
Area-P 1/2: 2.99 cm2
Height: 67 in
MV M vel: 5.3 m/s
MV Peak grad: 112.4 mmHg
S' Lateral: 3.2 cm
Weight: 2960 oz

## 2022-06-04 MED ORDER — METOPROLOL TARTRATE 25 MG PO TABS: 25.0000 mg | ORAL_TABLET | Freq: Two times a day (BID) | ORAL | 3 refills | Status: DC

## 2022-06-04 NOTE — Patient Instructions (Signed)
Medication Instructions:  Increase Metoprolol 25 mg twice daily  *If you need a refill on your cardiac medications before your next appointment, please call your pharmacy*   Lab Work: NONE ordered at this time of appointment   If you have labs (blood work) drawn today and your tests are completely normal, you will receive your results only by: [place] (if you have MyChart) OR A paper copy in the mail If you have any lab test that is abnormal or we need to change your treatment, we will call you to review the results.   Testing/Procedures: Your physician has requested that you have an echocardiogram. Echocardiography is a painless test that uses sound waves to create images of your heart. It provides your doctor with information about the size and shape of your heart and how well your heart's chambers and valves are working. This procedure takes approximately one hour. There are no restrictions for this procedure. Please do NOT wear cologne, perfume, aftershave, or lotions (deodorant is allowed). Please arrive 15 minutes prior to your appointment time.     Follow-Up: At [facility], you and your health needs are our priority.  As part of our continuing mission to provide you with exceptional heart care, we have created designated Provider Care Teams.  These Care Teams include your primary Cardiologist (physician) and Advanced Practice Providers (APPs -  Physician Assistants and Nurse Practitioners) who all work together to provide you with the care you need, when you need it.  We recommend signing up for the patient portal called "MyChart".  Sign up information is provided on this After Visit Summary.  MyChart is used to connect with patients for Virtual Visits (Telemedicine).  Patients are able to view lab/test results, encounter notes, upcoming appointments, etc.  Non-urgent messages can be sent to your provider as well.   To learn more about what you can do with  MyChart, go to [email or web address].    Your next appointment:   3 month(s)  Provider:   Minus Breeding, MD  or Diona Browner, NP        Other Instructions

## 2022-06-04 NOTE — Progress Notes (Unsigned)
Office Visit    Patient Name: Alice Moore Date of Encounter: 06/04/2022  Primary Care Provider:  Shon Baton, MD Primary Cardiologist:  Minus Breeding, MD  Chief Complaint    62 year old female with a history of mild nonobstructive CAD, precordial chest pain, frequent symptomatic PVCs, hyperlipidemia, and ankylosing spondylitis who presents for follow-up related to dizziness.  Past Medical History    Past Medical History:  Diagnosis Date   AS (ankylosing spondylitis) (HCC)    Past Surgical History:  Procedure Laterality Date   DILATION AND CURETTAGE OF UTERUS     TONSILLECTOMY     UTERINE FIBROID SURGERY N/A     Allergies  No Known Allergies   Labs/Other Studies Reviewed    The following studies were reviewed today: Coronary CTA 11/11/2021: IMPRESSION: 1. Coronary calcium score of 58.9. This was 51 percentile for age and sex matched control.   2. Normal coronary origin with right dominance.   3. CAD-RADS 2. Mild non-obstructive CAD (25-49%). Consider non-atherosclerotic causes of chest pain. Consider preventive therapy and risk factor modification.   The noncardiac portion of this study will be interpreted in separate report by the radiologist.   Cardiac monitor 05/2022: Predominant rhythm normal sinus Fairly frequent isolated premature ventricular contractions  Bigeminy noted No sustained arrhythmias.    Recent Labs: 03/29/2022: ALT 15; BUN 19; Creatinine, Ser 0.81; Hemoglobin 13.0; Platelets 316; Potassium 3.7; Sodium 137  Recent Lipid Panel    Component Value Date/Time   CHOL 161 04/30/2022 0811   TRIG 38 04/30/2022 0811   HDL 101 04/30/2022 0811   CHOLHDL 1.6 04/30/2022 0811   LDLCALC 51 04/30/2022 0811    History of Present Illness    62 year old female with the above past medical history including mild nonobstructive CAD, precordial chest pain, frequent symptomatic PVCs, hyperlipidemia, and ankylosing spondylitis.   She was referred to  cardiology by her PCP for the evaluation of chest pain.  She has a strong family history of early CAD.  Coronary CTA in 10/2021 revealed coronary calcium score of 58.9 (88th percentile), mild nonobstructive CAD (25 to 49% in the proximal LAD).   She was started on Crestor and was referred to a nutritionist.  She contacted our office on 04/21/2022 noting a several week history of dizziness, and a "funny feeling in her chest."  She stated that her Apple Watch showed "irregular heartbeat, bigeminy."  He was last seen in the office on 04/29/2022 frequent symptomatic PVCs.  He describes her symptoms as a fluttering in her chest with occasional dizziness and shortness of breath, as well as a "heavy" sensation in her neck.  Cardiac monitor revealed predominant sinus rhythm, frequent isolated PVCs, bigeminy. Patient was symptomatic with PVCs and bigeminy.  She was started on low-dose metoprolol and referred to EP.   She presents today for follow-up.  Since her last visit  Accompanied by her husband.  Since her last visit she has been stable overall from a cardiac standpoint though she continues to note frequent PVCs she appears to be in bigeminy upon auscultation today.  She has noted some increased fatigue with metoprolol.  No real reduction in PVC symptoms.  Will increase metoprolol to 25 mg twice daily.  Will check echocardiogram.  BP well-controlled.  She has follow-up scheduled with Dr. Tyrone Schimke in 06/08/2022.  Follow-up with Dr. Percival Spanish or myself in 3 months. 1. Dizziness/palpitations/frequent PVCs/bradycardia: She has a longstanding history of palpitations/PVCs.  However over the past 2 to 3 weeks she has noticed  very frequent symptomatic PVCs.  Her Apple Watch has given her notifications of frequent bigeminy.  She has also had some notifications of bradycardia with HR in the 40s.  Will check 14-day zio.  Discussed with Dr. Percival Spanish, will go ahead and refer to EP as I anticipate high PVC burden. Will start metoprolol  12.5 mg bid. Discussed ED precautions, potential triggers.    2. Nonobstructive CAD/precordial pain: Coronary CTA in 10/2021 revealed coronary calcium score of 58.9 (88th percentile), mild nonobstructive CAD (25 to 49% in the proximal LAD). Stable with no anginal symptoms. No indication for ischemic evaluation.  Continue Crestor.   3. Elevated BP reading: Be elevated in office today.  She has not been checking her BP at home.  She is not on any antihypertensive medication at this point in time.  Will start metoprolol as above. Continue to monitor and report BP consistently > 130/80.  Suspect she may need additional antihypertensive medication in the future.   4. Hyperlipidemia: LDL was 130 in 08/2021.  She overdue for repeat lipids.  She will come back for fasting lipid panel.  Continue Crestor.     5. Disposition: Follow-up in.   Home Medications    Current Outpatient Medications  Medication Sig Dispense Refill   albuterol (VENTOLIN HFA) 108 (90 Base) MCG/ACT inhaler as needed.     celecoxib (CELEBREX) 200 MG capsule Take 200 mg by mouth daily.     cetirizine (ZYRTEC ALLERGY) 10 MG tablet 1 tablet Orally Once a day     fluticasone (FLONASE) 50 MCG/ACT nasal spray Place into both nostrils daily.     IMVEXXY MAINTENANCE PACK 4 MCG INST Place 1 capsule vaginally 2 (two) times a week.     inFLIXimab (REMICADE) 100 MG injection Every 6 weeks.     metoprolol tartrate (LOPRESSOR) 25 MG tablet Take 0.5 tablets (12.5 mg total) by mouth 2 (two) times daily. 90 tablet 3   Multiple Vitamins-Minerals (PRESERVISION AREDS 2) CAPS as directed Orally     rosuvastatin (CRESTOR) 10 MG tablet Take 1 tablet (10 mg total) by mouth daily. 90 tablet 3   tiZANidine (ZANAFLEX) 4 MG tablet Take 4 mg by mouth at bedtime as needed.     Vitamin D, Cholecalciferol, 25 MCG (1000 UT) TABS 1 tablet Orally Once a day     No current facility-administered medications for this visit.     Review of Systems    ***.  All other  systems reviewed and are otherwise negative except as noted above.    Physical Exam    VS:  BP 126/82   Pulse (!) 46   Ht 5' 7"$  (1.702 m)   Wt 185 lb (83.9 kg)   SpO2 96%   BMI 28.98 kg/m  1 GEN: Well nourished, well developed, in no acute distress. HEENT: normal. Neck: Supple, no JVD, carotid bruits, or masses. Cardiac: RRR with audible ectopy, no murmurs, rubs, or gallops. No clubbing, cyanosis, edema.  Radials/DP/PT 2+ and equal bilaterally.  Respiratory:  Respirations regular and unlabored, clear to auscultation bilaterally. GI: Soft, nontender, nondistended, BS + x 4. MS: no deformity or atrophy. Skin: warm and dry, no rash. Neuro:  Strength and sensation are intact. Psych: Normal affect.  Accessory Clinical Findings    ECG personally reviewed by me today -no EKG in office today.  Lab Results  Component Value Date   WBC 9.7 03/29/2022   HGB 13.0 03/29/2022   HCT 39.7 03/29/2022   MCV 91.1 03/29/2022  PLT 316 03/29/2022   Lab Results  Component Value Date   CREATININE 0.81 03/29/2022   BUN 19 03/29/2022   NA 137 03/29/2022   K 3.7 03/29/2022   CL 103 03/29/2022   CO2 23 03/29/2022   Lab Results  Component Value Date   ALT 15 03/29/2022   AST 26 03/29/2022   ALKPHOS 93 03/29/2022   BILITOT 0.6 03/29/2022   Lab Results  Component Value Date   CHOL 161 04/30/2022   HDL 101 04/30/2022   LDLCALC 51 04/30/2022   TRIG 38 04/30/2022   CHOLHDL 1.6 04/30/2022    No results found for: "HGBA1C"  Assessment & Plan    1.  ***      Lenna Sciara, NP 06/04/2022, 8:35 AM

## 2022-06-06 ENCOUNTER — Encounter: Payer: Self-pay | Admitting: Nurse Practitioner

## 2022-06-08 ENCOUNTER — Encounter: Payer: Self-pay | Admitting: Cardiovascular Disease

## 2022-06-08 ENCOUNTER — Ambulatory Visit: Payer: 59 | Attending: Cardiovascular Disease | Admitting: Cardiovascular Disease

## 2022-06-08 VITALS — BP 128/70 | HR 56 | Ht 66.0 in | Wt 187.0 lb

## 2022-06-08 DIAGNOSIS — R002 Palpitations: Secondary | ICD-10-CM | POA: Diagnosis not present

## 2022-06-08 DIAGNOSIS — I493 Ventricular premature depolarization: Secondary | ICD-10-CM

## 2022-06-08 MED ORDER — DILTIAZEM HCL ER 120 MG PO CP12: 120.0000 mg | ORAL_CAPSULE | Freq: Two times a day (BID) | ORAL | 3 refills | Status: DC

## 2022-06-08 NOTE — Progress Notes (Signed)
Electrophysiology Office Note:    Date:  06/08/2022   ID:  Alice Moore, Alice Moore 03-Aug-1960, MRN [id number]  PCP:  Shon Baton, MD   [place] Providers Cardiologist:  Minus Breeding, MD     Referring MD: Lenna Sciara, NP   History of Present Illness:    Alice Moore is a 62 y.o. female with a hx of ankylosing spondylitis referred for arrhythmia management.  She has been having episodes of dizziness, shortness of breath, heavy sensation in the chest and neck. 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 She was started on low dose metoprolol. The metoprolol was increased, but still does not seem to be helping.  She is very symptomatic with PVCs but has difficulty describing the sensation she gets from them. Symptoms are reliably correlated with PVCs detected on her Apple watch.  No syncope, chest pain, CHF symptoms such as edema.   Past Medical History:  Diagnosis Date   AS (ankylosing spondylitis) (HCC)     Past Surgical History:  Procedure Laterality Date   DILATION AND CURETTAGE OF UTERUS     TONSILLECTOMY     UTERINE FIBROID SURGERY N/A     Current Medications: Current Meds  Medication Sig   albuterol (VENTOLIN HFA) 108 (90 Base) MCG/ACT inhaler as needed.   celecoxib (CELEBREX) 200 MG capsule Take 200 mg by mouth daily.   cetirizine (ZYRTEC ALLERGY) 10 MG tablet 1 tablet Orally Once a day   fluticasone (FLONASE) 50 MCG/ACT nasal spray Place into both nostrils daily.   IMVEXXY MAINTENANCE PACK 4 MCG INST Place 1 capsule vaginally 2 (two) times a week.   inFLIXimab (REMICADE) 100 MG injection Every 6 weeks.   metoprolol tartrate (LOPRESSOR) 25 MG tablet Take 1 tablet (25 mg total) by mouth 2 (two) times daily.   Multiple Vitamins-Minerals (PRESERVISION AREDS 2) CAPS as directed Orally   rosuvastatin (CRESTOR) 10 MG tablet Take 1 tablet (10 mg total) by mouth daily.   tiZANidine (ZANAFLEX) 4 MG tablet Take 4  mg by mouth at bedtime as needed.   Vitamin D, Cholecalciferol, 25 MCG (1000 UT) TABS 1 tablet Orally Once a day     Allergies:   Patient has no known allergies.   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    Social Determinants of Health   Financial Resource Strain: Not on file  Food Insecurity: Not on file  Transportation Needs: Not on file  Physical Activity: Not on file  Stress: Not on file  Social Connections: Not on file     Family History: The patient's family history includes Cancer in her mother and sister; Heart attack (age of onset: 81) in her mother; Heart disease (age  of onset: 63) in her father; Heart failure in her father; Stroke in her father.  ROS:   Please see the history of present illness.    All other systems reviewed and are negative.  EKGs/Labs/Other Studies Reviewed Today:     Monitor 05/2022 14 days Sinus rhythm HR 41-130, avg 69 6% PVCs. Monomorphic, sometimes occurring in bigeminy.  TTE 05/2022 EF 55-60%. Normal structure and function  EKG:  Last EKG results: today - sinus rhythm PVCs in bigeminy PVCs are negative in V1, V2 with abrupt transition in V3. + in II, III, aVF  Recent Labs: 03/29/2022: ALT 15; BUN 19; Creatinine, Ser 0.81; Hemoglobin 13.0; Platelets 316; Potassium 3.7; Sodium 137     Physical Exam:    VS:  BP 128/70   Pulse (!) 56   Ht 5' 6"$  (1.676 m)    Wt 187 lb (84.8 kg)   SpO2 99%   BMI 30.18 kg/m     Wt Readings from Last 3 Encounters:  06/08/22 187 lb (84.8 kg)  06/04/22 185 lb (83.9 kg)  04/29/22 184 lb 6.4 oz (83.6 kg)     GEN: Well nourished, well developed in no acute distress CARDIAC: RRR, no murmurs, rubs, gallops RESPIRATORY:  Normal work of breathing MUSCULOSKELETAL:  edema    ASSESSMENT & PLAN:    PVCs: not terribly frequent, but very symptomatic. She has only tried metoprolol so far. We will try diltiazem and magnesium. She will return  in about a month. If no relief with calcium channel blocker, we can try a broader betablocker such as propranolol.         Medication Adjustments/Labs and Tests Ordered: Current medicines are reviewed at length with the patient today.  Concerns regarding medicines are outlined above.  Orders Placed This Encounter  Procedures   EKG 12-Lead   No orders of the defined types were placed in this encounter.    Signed, Melida Quitter, MD  06/08/2022 3:27 PM    [place]

## 2022-06-08 NOTE — Patient Instructions (Signed)
Medication Instructions:  Your physician has recommended you make the following change in your medication:   1) STOP metoprolol tartrate 2) START diltiazem (Cardizem) 145m twice daily 3) START magnesium taurate (you can find this over the counter, take as directed on bottle).  *If you need a refill on your cardiac medications before your next appointment, please call your pharmacy*  Lab Work: None  Testing/Procedures: None  Follow-Up: [facility] you and your health needs are our priority.  As part of our continuing mission to provide you with exceptional heart care, we have created designated Provider Care Teams.  These Care Teams include your primary Cardiologist (physician) and Advanced Practice Providers (APPs -  Physician Assistants and Nurse Practitioners) who all work together to provide you with the care you need, when you need it.  Your next appointment:   1 month(s)  Provider:   ADoralee Albino MD

## 2022-06-09 ENCOUNTER — Telehealth: Payer: Self-pay

## 2022-06-09 NOTE — Telephone Encounter (Signed)
Spoke with pt. Pt was notified of echo results. Pt will continue her current medication and f/u as planned.

## 2022-07-15 ENCOUNTER — Encounter: Payer: Self-pay | Admitting: Cardiovascular Disease

## 2022-07-15 ENCOUNTER — Ambulatory Visit: Payer: 59 | Attending: Cardiovascular Disease | Admitting: Cardiovascular Disease

## 2022-07-15 VITALS — BP 150/90 | HR 72 | Ht 66.0 in | Wt 185.0 lb

## 2022-07-15 DIAGNOSIS — I493 Ventricular premature depolarization: Secondary | ICD-10-CM | POA: Diagnosis not present

## 2022-07-15 MED ORDER — ATENOLOL 25 MG PO TABS: 25.0000 mg | ORAL_TABLET | Freq: Two times a day (BID) | ORAL | 3 refills | Status: DC

## 2022-07-15 MED ORDER — ATENOLOL 25 MG PO TABS: 25.0000 mg | ORAL_TABLET | Freq: Every day | ORAL | 3 refills | Status: DC

## 2022-07-15 NOTE — Addendum Note (Signed)
Addended by: Bernestine Amass on: 07/15/2022 11:27 AM   Modules accepted: Orders

## 2022-07-15 NOTE — Progress Notes (Signed)
Electrophysiology Office Note:    Date:  07/15/2022   ID:  Alice Moore, Alice Moore [birth year], MRN [id number]  PCP:  Shon Baton, MD   [place] Providers Cardiologist:  Minus Breeding, MD Electrophysiologist:  Melida Quitter, MD     Referring MD: Shon Baton, MD   History of Present Illness:    Alice Moore is a 62 y.o. female with a hx of ankylosing spondylitis referred for arrhythmia management.  She has been having episodes of dizziness, shortness of breath, heavy sensation in the chest and neck. 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 She was started on low dose metoprolol. The metoprolol was increased, but still does not seem to be helping.  She is very symptomatic with PVCs but has difficulty describing the sensation she gets from them. Symptoms are reliably correlated with PVCs detected on her Apple watch.  At her last visit, we discontinued metoprolol and started diltiazem.  Initially she noticed some improvement PVCs, but symptoms and PVCs returned.  No syncope, chest pain, CHF symptoms such as edema.   Past Medical History:  Diagnosis Date   AS (ankylosing spondylitis) (HCC)     Past Surgical History:  Procedure Laterality Date   DILATION AND CURETTAGE OF UTERUS     TONSILLECTOMY     UTERINE FIBROID SURGERY N/A     Current Medications: Current Meds  Medication Sig   albuterol (VENTOLIN HFA) 108 (90 Base) MCG/ACT inhaler as needed.   celecoxib (CELEBREX) 200 MG capsule Take 200 mg by mouth daily.   cetirizine (ZYRTEC ALLERGY) 10 MG tablet 1 tablet Orally Once a day   diltiazem (CARDIZEM SR) 120 MG 12 hr capsule Take 1 capsule (120 mg total) by mouth 2 (two) times daily.   fluticasone (FLONASE) 50 MCG/ACT nasal spray Place into both nostrils daily.   IMVEXXY MAINTENANCE PACK 4 MCG INST Place 1 capsule vaginally 2 (two) times a week.   inFLIXimab (REMICADE) 100 MG injection Every 6 weeks.    Magnesium Oxide (NATRUL MAGNESIUM PO) Take by mouth. Pt taking Magnesium Taurate   Multiple Vitamins-Minerals (PRESERVISION AREDS 2) CAPS as directed Orally   rosuvastatin (CRESTOR) 10 MG tablet Take 1 tablet (10 mg total) by mouth daily.   tiZANidine (ZANAFLEX) 4 MG tablet Take 4 mg by mouth at bedtime as needed.   Vitamin D, Cholecalciferol, 25 MCG (1000 UT) TABS 1 tablet Orally Once a day     Allergies:   Patient has no known allergies.   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    Social Determinants of Health   Financial Resource Strain: Not on file  Food Insecurity: Not on file  Transportation Needs: Not on file  Physical  Activity: Not on file  Stress: Not on file  Social Connections: Not on file     Family History: The patient's family history includes Cancer in her mother and sister; Heart attack (age of onset: 47) in her mother; Heart disease (age of onset: 35) in her father; Heart failure in her father; Stroke in her father.  ROS:   Please see the history of present illness.    All other systems reviewed and are negative.  EKGs/Labs/Other Studies Reviewed Today:     Monitor 05/2022 14 days Sinus rhythm HR 41-130, avg 69 6% PVCs. Monomorphic, sometimes occurring in bigeminy.  TTE 05/2022 EF 55-60%. Normal structure and function  EKG:  Last EKG results: today - sinus rhythm PVCs in  bigeminy PVCs are negative in V1, V2 with abrupt transition in V3. + in II, III, aVF  Recent Labs: 03/29/2022: ALT 15; BUN 19; Creatinine, Ser 0.81; Hemoglobin 13.0; Platelets 316; Potassium 3.7; Sodium 137     Physical Exam:    VS:  BP (!) 150/90   Pulse 72   Ht 5\' 6"  (1.676 m)   Wt 185 lb (83.9 kg)   SpO2 98%   BMI 29.86 kg/m     Wt Readings from Last 3 Encounters:  07/15/22 185 lb (83.9 kg)  06/08/22 187 lb (84.8 kg)  06/04/22 185 lb (83.9 kg)     GEN: Well nourished, well developed in no acute distress CARDIAC: RRR, no murmurs, rubs, gallops RESPIRATORY:  Normal work of breathing MUSCULOSKELETAL:  no edema    ASSESSMENT & PLAN:    PVCs: not terribly frequent, but very symptomatic.  Metoprolol did not help.  Diltiazem helped initially, but PVCs returned.      -We will plan to switch her to atenolol 25 twice daily.  She would like to start this after she returns from an upcoming trip.      -She is going to increase her diltiazem dose to 240 mg twice a day in the interim.  If she does not notice any improvement, she will continue the diltiazem and start the atenolol.        Medication Adjustments/Labs and Tests Ordered: Current medicines are reviewed at length with the patient today.  Concerns regarding medicines are outlined above.  No orders of the defined types were placed in this encounter.  No orders of the defined types were placed in this encounter.    Signed, Melida Quitter, MD  07/15/2022 10:41 AM    [place]

## 2022-07-15 NOTE — Patient Instructions (Addendum)
Medication Instructions:   Your physician has recommended you make the following change in your medication:  1) INCREASE diltiazem to 240 mg daily  2) If you do not feel better after increasing your diltiazem then STOP taking diltiazem and START taking Atenolol 25 mg twice daily.  *Please call us to let us know what you would like to do.  Labs: CBC and BMET on August 13th anytime between 7:30am and 5:00pm  Procedures: Your physician has recommended that you have an ablation. Catheter ablation is a medical procedure used to treat some cardiac arrhythmias (irregular heartbeats). During catheter ablation, a long, thin, flexible tube is put into a blood vessel in your groin (upper thigh), or neck. This tube is called an ablation catheter. It is then guided to your heart through the blood vessel. Radio frequency waves destroy small areas of heart tissue where abnormal heartbeats may cause an arrhythmia to start. Please see the instruction sheet given to you today. You are scheduled for  PVC ablation  on Thursday, August 15 with Dr. Kaleen Odea.Please arrive at the Main Entrance A at [facility]: [place], [place] [address] at 8:30 AM   Follow-Up: At [facility], you and your health needs are our priority.  As part of our continuing mission to provide you with exceptional heart care, we have created designated Provider Care Teams.  These Care Teams include your primary Cardiologist (physician) and Advanced Practice Providers (APPs -  Physician Assistants and Nurse Practitioners) who all work together to provide you with the care you need, when you need it.  Your next appointment:   June 11th at 4:00pm

## 2022-08-02 ENCOUNTER — Encounter: Payer: Self-pay | Admitting: *Deleted

## 2022-08-30 ENCOUNTER — Ambulatory Visit: Payer: 59 | Admitting: Nurse Practitioner

## 2022-09-28 ENCOUNTER — Ambulatory Visit: Payer: 59 | Attending: Cardiovascular Disease | Admitting: Cardiovascular Disease

## 2022-09-28 ENCOUNTER — Encounter: Payer: Self-pay | Admitting: Cardiovascular Disease

## 2022-09-28 VITALS — BP 122/78 | HR 66 | Ht 67.0 in | Wt 182.0 lb

## 2022-09-28 DIAGNOSIS — I493 Ventricular premature depolarization: Secondary | ICD-10-CM

## 2022-09-28 MED ORDER — PROPRANOLOL HCL ER 60 MG PO CP24: 60.0000 mg | ORAL_CAPSULE | Freq: Every day | ORAL | 3 refills | Status: DC

## 2022-09-28 NOTE — Patient Instructions (Signed)
Medication Instructions:  Your physician has recommended you make the following change in your medication:  1) STOP taking atenolol  2) START taking propanolol 60 mg daily  *If you need a refill on your cardiac medications before your next appointment, please call your pharmacy*  Follow-Up: At [facility], you and your health needs are our priority.  As part of our continuing mission to provide you with exceptional heart care, we have created designated Provider Care Teams.  These Care Teams include your primary Cardiologist (physician) and Advanced Practice Providers (APPs -  Physician Assistants and Nurse Practitioners) who all work together to provide you with the care you need, when you need it.  Your next appointment:   2 months with Dr. Nelly Laurence

## 2022-09-28 NOTE — Progress Notes (Signed)
Electrophysiology Office Note:    Date:  09/28/2022   ID:  Hagen, Cancilla October 17, 1960, MRN [id number]  PCP:  Creola Corn, MD   [facility] Providers Cardiologist:  Rollene Rotunda, MD Electrophysiologist:  Maurice Small, MD     Referring MD: Creola Corn, MD   History of Present Illness:    Alice Moore is a 62 y.o. female with a hx of ankylosing spondylitis referred for arrhythmia management.  She has been having episodes of dizziness, shortness of breath, heavy sensation in the chest and neck. 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 She was started on low dose metoprolol. The metoprolol was increased, but still does not seem to be helping.  She is very symptomatic with PVCs but has difficulty describing the sensation she gets from them. Symptoms are reliably correlated with PVCs detected on her Apple watch.  Metoprolol did not affect the PVCs at all.  Diltiazem was helpful initially, but they recurred and she developed edema when the dose was increased.  She was switched to atenolol, which has been effective but causes fatigue.     Past Medical History:  Diagnosis Date   AS (ankylosing spondylitis) (HCC)     Past Surgical History:  Procedure Laterality Date   DILATION AND CURETTAGE OF UTERUS     TONSILLECTOMY     UTERINE FIBROID SURGERY N/A     Current Medications: Current Meds  Medication Sig   albuterol (VENTOLIN HFA) 108 (90 Base) MCG/ACT inhaler as needed.   atenolol (TENORMIN) 25 MG tablet Take 1 tablet (25 mg total) by mouth 2 (two) times daily.   celecoxib (CELEBREX) 200 MG capsule Take 200 mg by mouth as needed.   cetirizine (ZYRTEC ALLERGY) 10 MG tablet 1 tablet Orally Once a day   fluticasone (FLONASE) 50 MCG/ACT nasal spray Place 2 sprays into both nostrils as needed.   IMVEXXY MAINTENANCE PACK 4 MCG INST Place 1 capsule vaginally 2 (two) times a week.   inFLIXimab (REMICADE) 100 MG  injection Every 6 weeks.   Magnesium Oxide (NATRUL MAGNESIUM PO) Take by mouth. Pt taking Magnesium Taurate   Multiple Vitamins-Minerals (PRESERVISION AREDS 2) CAPS as directed Orally   rosuvastatin (CRESTOR) 10 MG tablet Take 1 tablet (10 mg total) by mouth daily.   tiZANidine (ZANAFLEX) 4 MG tablet Take 4 mg by mouth at bedtime as needed.   Vitamin D, Cholecalciferol, 25 MCG (1000 UT) TABS 1 tablet Orally Once a day     Allergies:   Patient has no known allergies.     ROS:   Please see the history of present illness.    All other systems reviewed and are negative.  EKGs/Labs/Other Studies Reviewed Today:     Monitor 05/2022 14 days Sinus rhythm HR 41-130, avg 69; 6% PVCs. Monomorphic, sometimes occurring in bigeminy.  TTE 05/2022 EF 55-60%. Normal structure and function  EKG:  Last EKG results: today - sinus rhythm PVCs in bigeminy PVCs are negative in V1, V2 with abrupt transition in V3. + in II, III, aVF  Recent Labs: 03/29/2022: ALT 15; BUN 19; Creatinine, Ser 0.81; Hemoglobin 13.0; Platelets 316; Potassium 3.7; Sodium 137     Physical Exam:    VS:  BP 122/78   Pulse 66   Ht 5\' 7"  (1.702 m)   Wt 182 lb (82.6 kg)   SpO2 95%   BMI 28.51 kg/m     Wt Readings from Last 3 Encounters:  09/28/22 182 lb (82.6 kg)  07/15/22 185 lb (83.9 kg)  06/08/22 187 lb (84.8 kg)     GEN: Well nourished, well developed in no acute distress CARDIAC: RRR, no murmurs, rubs, gallops RESPIRATORY:  Normal work of breathing MUSCULOSKELETAL:  no edema    ASSESSMENT & PLAN:    PVCs: not terribly frequent, but very symptomatic.  For 1 reason or another, metoprolol, diltiazem, and atenolol have not been successful.      -She is scheduled for PVC ablation in August      - will try propranolol in the interim. Follow-up a week or two prior to ablation        Medication Adjustments/Labs and Tests Ordered: Current medicines are reviewed at length with the patient today.  Concerns  regarding medicines are outlined above.  No orders of the defined types were placed in this encounter.  No orders of the defined types were placed in this encounter.    Signed, Maurice Small, MD  09/28/2022 4:35 PM    [facility]

## 2022-10-12 ENCOUNTER — Encounter: Payer: Self-pay | Admitting: Nurse Practitioner

## 2022-10-12 ENCOUNTER — Ambulatory Visit: Payer: 59 | Attending: Nurse Practitioner | Admitting: Nurse Practitioner

## 2022-10-12 VITALS — BP 130/86 | HR 58 | Ht 67.0 in | Wt 181.8 lb

## 2022-10-12 DIAGNOSIS — R002 Palpitations: Secondary | ICD-10-CM | POA: Diagnosis not present

## 2022-10-12 DIAGNOSIS — I493 Ventricular premature depolarization: Secondary | ICD-10-CM | POA: Diagnosis not present

## 2022-10-12 DIAGNOSIS — R42 Dizziness and giddiness: Secondary | ICD-10-CM

## 2022-10-12 DIAGNOSIS — I2510 Atherosclerotic heart disease of native coronary artery without angina pectoris: Secondary | ICD-10-CM

## 2022-10-12 DIAGNOSIS — E785 Hyperlipidemia, unspecified: Secondary | ICD-10-CM

## 2022-10-12 DIAGNOSIS — R030 Elevated blood-pressure reading, without diagnosis of hypertension: Secondary | ICD-10-CM

## 2022-10-12 NOTE — Patient Instructions (Addendum)
Medication Instructions:  Your physician recommends that you continue on your current medications as directed. Please refer to the Current Medication list given to you today.   *If you need a refill on your cardiac medications before your next appointment, please call your pharmacy*   Lab Work: NONE ordered at this time of appointment    Testing/Procedures: NONE ordered at this time of appointment   Follow-Up: At [facility], you and your health needs are our priority.  As part of our continuing mission to provide you with exceptional heart care, we have created designated Provider Care Teams.  These Care Teams include your primary Cardiologist (physician) and Advanced Practice Providers (APPs -  Physician Assistants and Nurse Practitioners) who all work together to provide you with the care you need, when you need it.  We recommend signing up for the patient portal called "MyChart".  Sign up information is provided on this After Visit Summary.  MyChart is used to connect with patients for Virtual Visits (Telemedicine).  Patients are able to view lab/test results, encounter notes, upcoming appointments, etc.  Non-urgent messages can be sent to your provider as well.   To learn more about what you can do with MyChart, go to [email or web address].    Your next appointment:   4-5 month(s)  Provider:   Rollene Rotunda, MD

## 2022-10-12 NOTE — Progress Notes (Signed)
Office Visit    Patient Name: Alice Moore Date of Encounter: 10/12/2022  Primary Care Provider:  Creola Corn, MD Primary Cardiologist:  Rollene Rotunda, MD  Chief Complaint    62 year old female with a history of mild nonobstructive CAD, precordial chest pain, frequent symptomatic PVCs, hyperlipidemia, and ankylosing spondylitis who presents for follow-up related to dizziness and palpitations.   Past Medical History    Past Medical History:  Diagnosis Date   AS (ankylosing spondylitis) (HCC)    Past Surgical History:  Procedure Laterality Date   DILATION AND CURETTAGE OF UTERUS     TONSILLECTOMY     UTERINE FIBROID SURGERY N/A     Allergies  No Known Allergies   Labs/Other Studies Reviewed    The following studies were reviewed today:  Cardiac Studies & Procedures       ECHOCARDIOGRAM  ECHOCARDIOGRAM COMPLETE 06/04/2022  Narrative ECHOCARDIOGRAM REPORT    Patient Name:   Alice Moore Date of Exam: 06/04/2022 Medical Rec #:  [id number]       Height:       67.0 in Accession #:    [id number]      Weight:       185.0 lb Date of Birth:  08-05-1960       BSA:          1.956 m Patient Age:    61 years        BP:           126/82 mmHg Patient Gender: F               HR:           56 bpm. Exam Location:  [address]  Procedure: 2D Echo, Color Doppler, Cardiac Doppler and 3D Echo  Indications:    Postural dizzines w/ pre-syncope R42; Frequent PVCs I49.3  History:        Patient has no prior history of Echocardiogram examinations. Arrythmias:PVC.  Sonographer:    Thurman Coyer RDCS Referring Phys: [phone] Pamila Mendibles C Keiland Pickering  IMPRESSIONS   1. Left ventricular ejection fraction, by estimation, is 55 to 60%. The left ventricle has normal function. The left ventricle has no regional wall motion abnormalities. Left ventricular diastolic parameters were normal. 2. Right ventricular systolic function is normal. The right ventricular size is normal. There is  normal pulmonary artery systolic pressure. 3. The mitral valve is normal in structure. Mild mitral valve regurgitation. No evidence of mitral stenosis. 4. The aortic valve is tricuspid. There is mild calcification of the aortic valve. Aortic valve regurgitation is trivial. No aortic stenosis is present. 5. The inferior vena cava is normal in size with greater than 50% respiratory variability, suggesting right atrial pressure of 3 mmHg.  FINDINGS Left Ventricle: Left ventricular ejection fraction, by estimation, is 55 to 60%. The left ventricle has normal function. The left ventricle has no regional wall motion abnormalities. The left ventricular internal cavity size was normal in size. There is no left ventricular hypertrophy. Left ventricular diastolic parameters were normal.  Right Ventricle: The right ventricular size is normal. No increase in right ventricular wall thickness. Right ventricular systolic function is normal. There is normal pulmonary artery systolic pressure. The tricuspid regurgitant velocity is 2.54 m/s, and with an assumed right atrial pressure of 3 mmHg, the estimated right ventricular systolic pressure is 28.8 mmHg.  Left Atrium: Left atrial size was normal in size.  Right Atrium: Right atrial size was normal in size.  Pericardium: There  is no evidence of pericardial effusion.  Mitral Valve: The mitral valve is normal in structure. Mild mitral valve regurgitation. No evidence of mitral valve stenosis.  Tricuspid Valve: The tricuspid valve is normal in structure. Tricuspid valve regurgitation is mild . No evidence of tricuspid stenosis.  Aortic Valve: The aortic valve is tricuspid. There is mild calcification of the aortic valve. Aortic valve regurgitation is trivial. No aortic stenosis is present.  Pulmonic Valve: The pulmonic valve was normal in structure. Pulmonic valve regurgitation is not visualized. No evidence of pulmonic stenosis.  Aorta: The aortic root is  normal in size and structure.  Venous: The inferior vena cava is normal in size with greater than 50% respiratory variability, suggesting right atrial pressure of 3 mmHg.  IAS/Shunts: No atrial level shunt detected by color flow Doppler.   LEFT VENTRICLE PLAX 2D LVIDd:         5.10 cm   Diastology LVIDs:         3.20 cm   LV e' medial:    7.07 cm/s LV PW:         0.80 cm   LV E/e' medial:  12.2 LV IVS:        0.80 cm   LV e' lateral:   9.46 cm/s LVOT diam:     2.00 cm   LV E/e' lateral: 9.2 LV SV:         60 LV SV Index:   31 LVOT Area:     3.14 cm  3D Volume EF: 3D EF:        59 % LV EDV:       141 ml LV ESV:       58 ml LV SV:        83 ml  RIGHT VENTRICLE RV Basal diam:  3.60 cm RV Mid diam:    3.70 cm RV S prime:     13.50 cm/s TAPSE (M-mode): 2.6 cm  LEFT ATRIUM             Index        RIGHT ATRIUM           Index LA diam:        3.70 cm 1.89 cm/m   RA Area:     19.40 cm LA Vol (A2C):   38.7 ml 19.78 ml/m  RA Volume:   56.50 ml  28.88 ml/m LA Vol (A4C):   68.8 ml 35.17 ml/m LA Biplane Vol: 52.7 ml 26.94 ml/m AORTIC VALVE LVOT Vmax:   81.70 cm/s LVOT Vmean:  52.100 cm/s LVOT VTI:    0.192 m  AORTA Ao Root diam: 3.20 cm Ao Asc diam:  3.60 cm  MITRAL VALVE               TRICUSPID VALVE MV Area (PHT): 2.99 cm    TR Peak grad:   25.8 mmHg MV Decel Time: 254 msec    TR Vmax:        254.00 cm/s MR Peak grad: 112.4 mmHg MR Mean grad: 78.0 mmHg    SHUNTS MR Vmax:      530.00 cm/s  Systemic VTI:  0.19 m MR Vmean:     424.0 cm/s   Systemic Diam: 2.00 cm MV E velocity: 86.60 cm/s MV A velocity: 72.20 cm/s MV E/A ratio:  1.20  Arvilla Meres MD Electronically signed by Arvilla Meres MD Signature Date/Time: 06/04/2022/5:15:26 PM    Final    MONITORS  LONG TERM MONITOR (3-14  DAYS) 05/29/2022  Narrative Predominant rhythm normal sinus Fairly frequent isolated premature ventricular contractions Bigeminy noted No sustained arrhythmias.   CT  SCANS  CT CORONARY MORPH W/CTA COR W/SCORE 11/11/2021  Addendum 11/11/2021  4:25 PM ADDENDUM REPORT: 11/11/2021 16:23  EXAM: OVER-READ INTERPRETATION  CT CHEST  The following report is a limited chest CT over-read performed by radiologist Dr. Jeronimo Greaves of [facility] on 11/11/2021. This over-read does not include interpretation of cardiac or coronary anatomy or pathology. The coronary CTA interpretation by the cardiologist is attached.  COMPARISON:  10/12/2013 chest radiograph.  FINDINGS: Vascular: Aortic atherosclerosis. No central pulmonary embolism, on this non-dedicated study.  Mediastinum/Nodes: No imaged thoracic adenopathy. Tiny hiatal hernia.  Lungs/Pleura: No pleural fluid. Right lower lobe pulmonary nodule of 4 mm on 15/11.  Upper Abdomen: Normal imaged portions of the liver, spleen, stomach, adrenal glands, kidneys.  Musculoskeletal: No acute osseous abnormality.  IMPRESSION: No acute findings in the imaged extracardiac chest. Aortic Atherosclerosis (ICD10-I70.0).  Right lower lobe 4 mm pulmonary nodule. No follow-up needed if patient is low-risk. Non-contrast chest CT can be considered in 12 months if patient is high-risk. This recommendation follows the consensus statement: Guidelines for Management of Incidental Pulmonary Nodules Detected on CT Images: From the Fleischner Society 2017; Radiology 2017; 284:228-243.   Electronically Signed By: Jeronimo Greaves M.D. On: 11/11/2021 16:23  Narrative CLINICAL DATA:  This is a 62 year old female with anginal symptoms.  EXAM: Cardiac/Coronary  CTA  TECHNIQUE: The patient was scanned on a [employer].  FINDINGS: A 100 kV prospective scan was triggered in the descending thoracic aorta at 111 HU's. Axial non-contrast 3 mm slices were carried out through the heart. The data set was analyzed on a dedicated work station and scored using the Agatson method. Gantry rotation speed was 250  msecs and collimation was .6 mm. No beta blockade and 0.8 mg of sl NTG was given. The 3D data set was reconstructed in 5% intervals of the 67-82 % of the R-R cycle. Diastolic phases were analyzed on a dedicated work station using MPR, MIP and VRT modes. The patient received 80 cc of contrast.  Aorta: Normal size.  No calcifications.  No dissection.  Aortic Valve:  Trileaflet.  No calcifications.  Coronary Arteries:  Normal coronary origin.  Right dominance.  RCA is a large dominant artery that gives rise to PDA and PLA. There is no plaque.  Left main is a large artery that gives rise to LAD and LCX arteries.  LAD is a large vessel with mild (24-49%) mixed plaque in the proximal LAD. The mid and distal portion with no plaques. D1,D2,D3 with no plaques.  LCX is a non-dominant artery that gives rise to one large OM1 branch. There is no plaque.  Coronary Calcium Score:  Left main: 0  Left anterior descending artery: 58.9  Left circumflex artery: 0  Right coronary artery: 0  Total: 58.9  Percentile: 88  Other findings:  Normal pulmonary vein drainage into the left atrium.  Normal left atrial appendage without a thrombus.  Normal size of the pulmonary artery.  IMPRESSION: 1. Coronary calcium score of 58.9. This was 13 percentile for age and sex matched control.  2. Normal coronary origin with right dominance.  3. CAD-RADS 2. Mild non-obstructive CAD (25-49%). Consider non-atherosclerotic causes of chest pain. Consider preventive therapy and risk factor modification.  The noncardiac portion of this study will be interpreted in separate report by the radiologist.  Electronically Signed:  By: Thomasene Ripple D.O. On: 11/11/2021 14:55         Recent Labs: 03/29/2022: ALT 15; BUN 19; Creatinine, Ser 0.81; Hemoglobin 13.0; Platelets 316; Potassium 3.7; Sodium 137  Recent Lipid Panel    Component Value Date/Time   CHOL 161 04/30/2022 0811   TRIG 38 04/30/2022 0811    HDL 101 04/30/2022 0811   CHOLHDL 1.6 04/30/2022 0811   LDLCALC 51 04/30/2022 0811    History of Present Illness    62 year old female with the above past medical history including mild nonobstructive CAD, precordial chest pain, frequent symptomatic PVCs, hyperlipidemia, and ankylosing spondylitis.   She was referred to cardiology by her PCP for the evaluation of chest pain.  She has a strong family history of early CAD.  Coronary CTA in 10/2021 revealed coronary calcium score of 58.9 (88th percentile), mild nonobstructive CAD (25 to 49% in the proximal LAD). She was started on Crestor and was referred to a nutritionist.  She contacted our office on 04/21/2022 noting a several week history of dizziness, and a "funny feeling in her chest."  She stated that her Apple Watch showed "irregular heartbeat, bigeminy."  Cardiac monitor revealed predominantly sinus rhythm, frequent isolated PVCs and bigeminy. Patient was symptomatic with PVCs/bigeminy.  Echo showed EF 55 to 60%, normal LV function, no RWMA, mild mitral valve regurgitation.  She was started on low-dose metoprolol and referred to EP.  Metoprolol was transition to diltiazem with little improvement in her symptoms.  She was later switched to atenolol which was more effective but caused fatigue.  She was last seen in the office on 09/28/2022 by EP. Atenolol was transitioned to propranolol.  She was scheduled for PVC ablation in 11/2022.     She presents today for follow-up accompanied by her husband.  Since her last visit she has been stable from a cardiac standpoint.  She notes rare palpitations and is overall tolerating the propranolol well though she does note ongoing fatigue and occasional low heart rate in the 40s bpm.  She denies any presyncope, syncope.  Other than her ongoing fatigue, she reports feeling well.  Home Medications    Current Outpatient Medications  Medication Sig Dispense Refill   albuterol (VENTOLIN HFA) 108 (90 Base) MCG/ACT  inhaler as needed.     celecoxib (CELEBREX) 200 MG capsule Take 200 mg by mouth as needed.     cetirizine (ZYRTEC ALLERGY) 10 MG tablet 1 tablet Orally Once a day     fluticasone (FLONASE) 50 MCG/ACT nasal spray Place 2 sprays into both nostrils as needed.     IMVEXXY MAINTENANCE PACK 4 MCG INST Place 1 capsule vaginally 2 (two) times a week.     inFLIXimab (REMICADE) 100 MG injection Every 6 weeks.     Magnesium Oxide (NATRUL MAGNESIUM PO) Take by mouth. Pt taking Magnesium Taurate     Multiple Vitamins-Minerals (PRESERVISION AREDS 2) CAPS as directed Orally     propranolol ER (INDERAL LA) 60 MG 24 hr capsule Take 1 capsule (60 mg total) by mouth daily. 90 capsule 3   rosuvastatin (CRESTOR) 10 MG tablet Take 1 tablet (10 mg total) by mouth daily. 90 tablet 3   tiZANidine (ZANAFLEX) 4 MG tablet Take 4 mg by mouth at bedtime as needed.     Vitamin D, Cholecalciferol, 25 MCG (1000 UT) TABS 1 tablet Orally Once a day     No current facility-administered medications for this visit.     Review of Systems  She denies chest pain, dyspnea, pnd, orthopnea, n, v, dizziness, syncope, edema, weight gain, or early satiety. All other systems reviewed and are otherwise negative except as noted above.   Physical Exam    VS:  BP 130/86 (BP Location: Left Arm, Patient Position: Sitting, Cuff Size: Normal)   Pulse (!) 58   Ht 5\' 7"  (1.702 m)   Wt 181 lb 12.8 oz (82.5 kg)   SpO2 100%   BMI 28.47 kg/m  GEN: Well nourished, well developed, in no acute distress. HEENT: normal. Neck: Supple, no JVD, carotid bruits, or masses. Cardiac: RRR, no murmurs, rubs, or gallops. No clubbing, cyanosis, edema.  Radials/DP/PT 2+ and equal bilaterally.  Respiratory:  Respirations regular and unlabored, clear to auscultation bilaterally. GI: Soft, nontender, nondistended, BS + x 4. MS: no deformity or atrophy. Skin: warm and dry, no rash. Neuro:  Strength and sensation are intact. Psych: Normal affect.  Accessory  Clinical Findings    ECG personally reviewed by me today -    - no acute changes.   Lab Results  Component Value Date   WBC 9.7 03/29/2022   HGB 13.0 03/29/2022   HCT 39.7 03/29/2022   MCV 91.1 03/29/2022   PLT 316 03/29/2022   Lab Results  Component Value Date   CREATININE 0.81 03/29/2022   BUN 19 03/29/2022   NA 137 03/29/2022   K 3.7 03/29/2022   CL 103 03/29/2022   CO2 23 03/29/2022   Lab Results  Component Value Date   ALT 15 03/29/2022   AST 26 03/29/2022   ALKPHOS 93 03/29/2022   BILITOT 0.6 03/29/2022   Lab Results  Component Value Date   CHOL 161 04/30/2022   HDL 101 04/30/2022   LDLCALC 51 04/30/2022   TRIG 38 04/30/2022   CHOLHDL 1.6 04/30/2022    No results found for: "HGBA1C"  Assessment & Plan    1. Dizziness/palpitations/frequent PVCs: Longstanding history of symptomatic palpitations/PVCs. Cardiac monitor in 05/2022 revealed predominantly sinus rhythm, frequent PVCs (6%), bigeminy.  She did not tolerate metoprolol, atenolol, or diltiazem.  She is tolerating propranolol but does note ongoing fatigue, occasionally low HR.  Denies presyncope, syncope.  Palpitations are generally well-controlled.  Pending PVC ablation in 11/2022 with EP.  I advised her to continue to monitor HR, symptoms.  Should she have going significant fatigue, could consider decreasing propranolol.  For now, continue propranolol at current dose.   2. Nonobstructive CAD/precordial pain: Coronary CTA in 10/2021 revealed coronary calcium score of 58.9 (88th percentile), mild nonobstructive CAD (25 to 49% in the proximal LAD). Stable with no anginal symptoms. No indication for ischemic evaluation.  Continue Crestor.   3. Elevated BP reading: BP well controlled in office today. Continue current antihypertensive regimen.    4. Hyperlipidemia: Most recent LDL was 51 in 04/2022.  Continue Crestor.     5. Disposition: Follow-up as scheduled with Dr. Nelly Laurence in 10/2022, follow-up with Dr. Antoine Poche in  4-5 months.       Joylene Grapes, NP 10/12/2022, 8:28 AM

## 2022-10-22 ENCOUNTER — Encounter: Payer: Self-pay | Admitting: Cardiovascular Disease

## 2022-11-15 ENCOUNTER — Ambulatory Visit: Payer: 59 | Admitting: Cardiovascular Disease

## 2022-11-15 ENCOUNTER — Encounter: Payer: Self-pay | Admitting: Cardiovascular Disease

## 2022-11-15 VITALS — BP 122/80 | HR 56 | Ht 67.0 in | Wt 184.0 lb

## 2022-11-15 DIAGNOSIS — R002 Palpitations: Secondary | ICD-10-CM

## 2022-11-15 DIAGNOSIS — I493 Ventricular premature depolarization: Secondary | ICD-10-CM | POA: Diagnosis not present

## 2022-11-15 MED ORDER — PROPRANOLOL HCL 10 MG PO TABS: ORAL_TABLET | ORAL | 0 refills | Status: AC

## 2022-11-15 NOTE — Progress Notes (Signed)
  Electrophysiology Office Note:    Date:  11/15/2022   ID:  Alice Moore, Alice Moore [birth year], MRN [id number]  PCP:  Creola Corn, MD    [facility] Providers Cardiologist:  Rollene Rotunda, MD Electrophysiologist:  Maurice Small, MD     Referring MD: Creola Corn, MD   History of Present Illness:    Alice Moore is a 62 y.o. female with a hx of ankylosing spondylitis referred for arrhythmia management.      She has been having episodes of dizziness, shortness of breath, heavy sensation in the chest and neck. 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 She was started on low dose metoprolol. The metoprolol was increased, but still does not seem to be helping.  She is very symptomatic with PVCs but has difficulty describing the sensation she gets from them. Symptoms are reliably correlated with PVCs detected on her Apple watch.  Metoprolol did not affect the PVCs at all.  Diltiazem was helpful initially, but they recurred and she developed edema when the dose was increased.  She was switched to atenolol, which has been effective but causes fatigue.      Her PVCs are markedly better after starting propranolol.  However, she is very fatigued.  She has increased daytime sleepiness and cannot stay up to watch a movie.    EKGs/Labs/Other Studies Reviewed Today:     Monitor 05/2022 14 days Sinus rhythm HR 41-130, avg 69; 6% PVCs. Monomorphic, sometimes occurring in bigeminy.  TTE 05/2022 EF 55-60%. Normal structure and function  EKG:  Last EKG results: today - sinus rhythm PVCs in bigeminy PVCs are negative in V1, V2 with abrupt transition in V3. + in II, III, aVF  Recent Labs: 03/29/2022: ALT 15; BUN 19; Creatinine, Ser 0.81; Hemoglobin 13.0; Platelets 316; Potassium 3.7; Sodium 137     Physical Exam:    VS:  BP 122/80   Pulse (!) 56   Ht 5\' 7"  (1.702 m)   Wt 184 lb (83.5 kg)   SpO2 98%   BMI 28.82 kg/m     Wt  Readings from Last 3 Encounters:  11/15/22 184 lb (83.5 kg)  10/12/22 181 lb 12.8 oz (82.5 kg)  09/28/22 182 lb (82.6 kg)     GEN: Well nourished, well developed in no acute distress CARDIAC: RRR, no murmurs, rubs, gallops RESPIRATORY:  Normal work of breathing MUSCULOSKELETAL:  no edema    ASSESSMENT & PLAN:    PVCs:  not terribly frequent, but very symptomatic.   Much better with propranolol (metoprolol, diltiazem, and atenolol worked transiently) She is scheduled for PVC ablation in August She is going to Dc propranolol and see how she does  Will try PRN propranolol in the interim If she is doing well with medical therapy, she may cancel the ablation        Medication Adjustments/Labs and Tests Ordered: Current medicines are reviewed at length with the patient today.  Concerns regarding medicines are outlined above.  Orders Placed This Encounter  Procedures   EKG 12-Lead   No orders of the defined types were placed in this encounter.    Signed, Maurice Small, MD  11/15/2022 1:48 PM     [facility]

## 2022-11-15 NOTE — Patient Instructions (Signed)
Medication Instructions:  START Propranolol 10 mg daily as needed *If you need a refill on your cardiac medications before your next appointment, please call your pharmacy*   Lab Work: CBC and BMET TODAY If you have labs (blood work) drawn today and your tests are completely normal, you will receive your results only by: MyChart Message (if you have MyChart) OR A paper copy in the mail If you have any lab test that is abnormal or we need to change your treatment, we will call you to review the results.   Follow-Up: At [facility], you and your health needs are our priority.  As part of our continuing mission to provide you with exceptional heart care, we have created designated Provider Care Teams.  These Care Teams include your primary Cardiologist (physician) and Advanced Practice Providers (APPs -  Physician Assistants and Nurse Practitioners) who all work together to provide you with the care you need, when you need it.  We recommend signing up for the patient portal called "MyChart".  Sign up information is provided on this After Visit Summary.  MyChart is used to connect with patients for Virtual Visits (Telemedicine).  Patients are able to view lab/test results, encounter notes, upcoming appointments, etc.  Non-urgent messages can be sent to your provider as well.   To learn more about what you can do with MyChart, go to [email or web address].    Your next appointment:   We will schedule follow up after ablation  Provider:   York Pellant, MD

## 2022-11-22 ENCOUNTER — Telehealth: Payer: Self-pay | Admitting: Cardiovascular Disease

## 2022-11-22 NOTE — Telephone Encounter (Signed)
Patient called wanting to cancel her ablation she has scheduled for 12/02/22, she does not want to reschedule at this time.

## 2022-11-24 NOTE — Telephone Encounter (Signed)
I called pt back to confirm I got her message. She would like to cancel her ablation and f/u with Dr. Nelly Laurence in December.

## 2022-11-24 NOTE — Telephone Encounter (Signed)
Patient called again stating she does not want to do the ablation and wants to cancel procedure scheduled on 8/15.  Patient want a call back to confirm.

## 2022-11-30 ENCOUNTER — Other Ambulatory Visit: Payer: 59

## 2022-12-02 ENCOUNTER — Ambulatory Visit (HOSPITAL_COMMUNITY): Admit: 2022-12-02 | Payer: 59 | Admitting: Cardiovascular Disease

## 2022-12-02 ENCOUNTER — Encounter (HOSPITAL_COMMUNITY): Payer: Self-pay

## 2022-12-02 SURGERY — PVC ABLATION
Anesthesia: General

## 2023-01-28 ENCOUNTER — Other Ambulatory Visit: Payer: Self-pay | Admitting: Cardiology

## 2023-02-13 DIAGNOSIS — I493 Ventricular premature depolarization: Secondary | ICD-10-CM | POA: Insufficient documentation

## 2023-02-13 DIAGNOSIS — R002 Palpitations: Secondary | ICD-10-CM | POA: Insufficient documentation

## 2023-02-13 NOTE — Progress Notes (Unsigned)
  Cardiology Office Note:   Date:  02/14/2023  ID:  Alice Moore, Alice Moore 12-04-1960, MRN [id number] PCP: Creola Corn, MD  [facility] Providers Cardiologist:  Rollene Rotunda, MD Electrophysiologist:  Maurice Small, MD {  History of Present Illness:   Alice Moore is a 62 y.o. female who is referred by Creola Corn, MD for evaluation of chest pain.   In July she had mild coronary plaque.  There was 24 - 49% in the mid and distal LAD.    She has been seen by EP for treatment of symptomatic PVCs.    Patient says that she was taking propranolol 60 mg extended release but it made her feel fatigued.  So she stopped taking this.  She said it was helping with her palpitations however.  She denies any presyncope or syncope.  She is not having any chest pressure, neck or arm discomfort.  She has had no weight gain or edema.  She does walking about 3 days a week 2 and half miles an hour.  She does get some chest discomfort but this has been a stable pattern sometimes with exertion sometimes at rest.  It is unchanged from when she had her CT of her aorta in 2023.  ROS: As stated in the HPI and negative for all other systems.  Studies Reviewed:    EKG:   EKG Interpretation Date/Time:  Monday February 14 2023 10:12:11 EDT Ventricular Rate:  57 PR Interval:  192 QRS Duration:  86 QT Interval:  440 QTC Calculation: 428 R Axis:   1  Text Interpretation: Sinus bradycardia When compared with ECG of 15-Nov-2022 13:38, No significant change was found Confirmed by Rollene Rotunda ([id number]) on 02/14/2023 10:27:31 AM     Risk Assessment/Calculations:            Physical Exam:   VS:  BP 138/88 (BP Location: Left Arm, Patient Position: Sitting, Cuff Size: Normal)   Pulse (!) 57   Ht 5\' 7"  (1.702 m)   Wt 181 lb (82.1 kg)   SpO2 98%   BMI 28.35 kg/m    Wt Readings from Last 3 Encounters:  02/14/23 181 lb (82.1 kg)  11/15/22 184 lb (83.5 kg)  10/12/22 181 lb 12.8 oz (82.5 kg)     GEN:  Well nourished, well developed in no acute distress NECK: No JVD; No carotid bruits CARDIAC: RRR, no murmurs, rubs, gallops RESPIRATORY:  Clear to auscultation without rales, wheezing or rhonchi  ABDOMEN: Soft, non-tender, non-distended EXTREMITIES:  No edema; No deformity   ASSESSMENT AND PLAN:   Precordial chest pain:    This is a stable pattern of a nonanginal sounding pain.  She had nonobstructive coronary disease.  No further workup and she can discuss this with her primary provider.  She will continue with risk reduction.   Dyslipidemia: Her LDL was down 130-51 on a low-dose of Crestor.  She will continue with this.   PVCs: She still having symptomatic PVCs and wonders i might feel better with propranolol XL 30 mg daily.     Follow up with me in one year.  She does have follow up with Dr. Nelly Laurence in December.   Signed, Rollene Rotunda, MD

## 2023-02-14 ENCOUNTER — Ambulatory Visit: Payer: 59 | Attending: Cardiology | Admitting: Cardiology

## 2023-02-14 ENCOUNTER — Encounter: Payer: Self-pay | Admitting: Cardiology

## 2023-02-14 VITALS — BP 138/88 | HR 57 | Ht 67.0 in | Wt 181.0 lb

## 2023-02-14 DIAGNOSIS — I493 Ventricular premature depolarization: Secondary | ICD-10-CM | POA: Diagnosis not present

## 2023-02-14 DIAGNOSIS — R072 Precordial pain: Secondary | ICD-10-CM

## 2023-02-14 DIAGNOSIS — R002 Palpitations: Secondary | ICD-10-CM | POA: Diagnosis not present

## 2023-02-14 DIAGNOSIS — E785 Hyperlipidemia, unspecified: Secondary | ICD-10-CM

## 2023-02-14 MED ORDER — PROPRANOLOL HCL 20 MG PO TABS: ORAL_TABLET | ORAL | 3 refills | Status: DC

## 2023-02-14 NOTE — Patient Instructions (Addendum)
Medication Instructions:     Reduce Propanolol  to 30 mg  ( take  20 mg )in the morning  and 10 mg in the evening  No other changes   *If you need a refill on your cardiac medications before your next appointment, please call your pharmacy*   Lab Work: Not needed    Testing/Procedures:  Not needed  Follow-Up: At [facility], you and your health needs are our priority.  As part of our continuing mission to provide you with exceptional heart care, we have created designated Provider Care Teams.  These Care Teams include your primary Cardiologist (physician) and Advanced Practice Providers (APPs -  Physician Assistants and Nurse Practitioners) who all work together to provide you with the care you need, when you need it.     Your next appointment:   12 month(s)  The format for your next appointment:   In Person  Provider:   Rollene Rotunda, MD

## 2023-03-23 ENCOUNTER — Ambulatory Visit: Payer: 59 | Admitting: Cardiovascular Disease

## 2023-04-04 ENCOUNTER — Ambulatory Visit: Payer: 59 | Attending: Cardiovascular Disease | Admitting: Cardiovascular Disease

## 2023-04-04 ENCOUNTER — Encounter: Payer: Self-pay | Admitting: Cardiovascular Disease

## 2023-04-04 VITALS — BP 114/68 | HR 61 | Ht 67.0 in | Wt 183.2 lb

## 2023-04-04 DIAGNOSIS — R002 Palpitations: Secondary | ICD-10-CM

## 2023-04-04 DIAGNOSIS — I493 Ventricular premature depolarization: Secondary | ICD-10-CM

## 2023-04-04 DIAGNOSIS — R001 Bradycardia, unspecified: Secondary | ICD-10-CM

## 2023-04-04 NOTE — Patient Instructions (Signed)

## 2023-04-04 NOTE — Progress Notes (Signed)
  Electrophysiology Office Note:    Date:  04/04/2023   ID:  Alice, Moore 1960/05/07, MRN [id number]  PCP:  Creola Corn, MD   [facility] Providers Cardiologist:  Rollene Rotunda, MD Electrophysiologist:  Maurice Small, MD     Referring MD: Creola Corn, MD   History of Present Illness:    Alice Moore is a 62 y.o. female with a hx of ankylosing spondylitis referred for arrhythmia management.      She has been having episodes of dizziness, shortness of breath, heavy sensation in the chest and neck. 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 She was started on low dose metoprolol. The metoprolol was increased, but still does not seem to be helping.  She is very symptomatic with PVCs but has difficulty describing the sensation she gets from them. Symptoms are reliably correlated with PVCs detected on her Apple watch.  Metoprolol did not affect the PVCs at all.  Diltiazem was helpful initially, but they recurred and she developed edema when the dose was increased.  She was switched to atenolol, which has been effective but causes fatigue.      Her PVCs are markedly better after starting propranolol.  However, she was very fatigued.  So the propranolol was stopped.  She was scheduled for an ablation procedure in August 2024 but canceled because she wasn't having terrible symptoms at the time.   EKGs/Labs/Other Studies Reviewed Today:     Monitor 05/2022 14 days Sinus rhythm HR 41-130, avg 69; 6% PVCs. Monomorphic, sometimes occurring in bigeminy.  TTE 05/2022 EF 55-60%. Normal structure and function  EKG:  Last EKG results: today - sinus rhythm PVCs in bigeminy PVCs are negative in V1, V2 with abrupt transition in V3. + in II, III, aVF  Recent Labs: 11/15/2022: BUN 21; Creatinine, Ser 0.90; Hemoglobin 12.6; Platelets 277; Potassium 4.5; Sodium 143     Physical Exam:    VS:  BP 114/68 (BP Location: Left Arm,  Patient Position: Sitting, Cuff Size: Normal)   Pulse 61   Ht 5\' 7"  (1.702 m)   Wt 183 lb 3.2 oz (83.1 kg)   SpO2 97%   BMI 28.69 kg/m     Wt Readings from Last 3 Encounters:  04/04/23 183 lb 3.2 oz (83.1 kg)  02/14/23 181 lb (82.1 kg)  11/15/22 184 lb (83.5 kg)     GEN: Well nourished, well developed in no acute distress CARDIAC: RRR, no murmurs, rubs, gallops RESPIRATORY:  Normal work of breathing MUSCULOSKELETAL:  no edema    ASSESSMENT & PLAN:    PVCs:  not terribly frequent, but very symptomatic.   Much better with propranolol (metoprolol, diltiazem, and atenolol worked transiently) She is now on a reduced dose of propranolol and symptoms are manageable Will not plan to reschedule the ablation.       Medication Adjustments/Labs and Tests Ordered: Current medicines are reviewed at length with the patient today.  Concerns regarding medicines are outlined above.  Orders Placed This Encounter  Procedures   EKG 12-Lead   No orders of the defined types were placed in this encounter.    Signed, Maurice Small, MD  04/04/2023 11:54 AM    [facility]

## 2023-11-28 ENCOUNTER — Other Ambulatory Visit: Payer: Self-pay | Admitting: Cardiology

## 2024-03-11 ENCOUNTER — Other Ambulatory Visit: Payer: Self-pay | Admitting: Cardiology
